# Patient Record
Sex: Female | Born: 1970 | Race: White | Hispanic: No | Smoking: Never smoker
Health system: Southern US, Community
[De-identification: ages and names within clinical notes are randomized; demographics above are authoritative.]

## PROBLEM LIST (undated history)

## (undated) DIAGNOSIS — Z9189 Other specified personal risk factors, not elsewhere classified: Secondary | ICD-10-CM

## (undated) DIAGNOSIS — G43909 Migraine, unspecified, not intractable, without status migrainosus: Secondary | ICD-10-CM

## (undated) DIAGNOSIS — J309 Allergic rhinitis, unspecified: Secondary | ICD-10-CM

## (undated) HISTORY — DX: Other specified personal risk factors, not elsewhere classified: Z91.89

## (undated) HISTORY — DX: Migraine, unspecified, not intractable, without status migrainosus: G43.909

## (undated) HISTORY — DX: Allergic rhinitis, unspecified: J30.9

---

## 1999-04-03 ENCOUNTER — Other Ambulatory Visit: Admission: RE | Admit: 1999-04-03 | Discharge: 1999-04-03 | Payer: Self-pay | Admitting: Obstetrics and Gynecology

## 2000-04-20 ENCOUNTER — Other Ambulatory Visit: Admission: RE | Admit: 2000-04-20 | Discharge: 2000-04-20 | Payer: Self-pay | Admitting: Obstetrics and Gynecology

## 2001-04-20 ENCOUNTER — Other Ambulatory Visit: Admission: RE | Admit: 2001-04-20 | Discharge: 2001-04-20 | Payer: Self-pay | Admitting: Obstetrics and Gynecology

## 2002-04-11 ENCOUNTER — Other Ambulatory Visit: Admission: RE | Admit: 2002-04-11 | Discharge: 2002-04-11 | Payer: Self-pay | Admitting: Obstetrics and Gynecology

## 2002-04-14 ENCOUNTER — Ambulatory Visit (HOSPITAL_COMMUNITY): Admission: RE | Admit: 2002-04-14 | Discharge: 2002-04-14 | Payer: Self-pay | Admitting: Obstetrics and Gynecology

## 2002-04-14 ENCOUNTER — Encounter (INDEPENDENT_AMBULATORY_CARE_PROVIDER_SITE_OTHER): Payer: Self-pay

## 2003-02-16 ENCOUNTER — Emergency Department (HOSPITAL_COMMUNITY): Admission: EM | Admit: 2003-02-16 | Discharge: 2003-02-16 | Payer: Self-pay | Admitting: Emergency Medicine

## 2003-04-23 ENCOUNTER — Other Ambulatory Visit: Admission: RE | Admit: 2003-04-23 | Discharge: 2003-04-23 | Payer: Self-pay | Admitting: Obstetrics and Gynecology

## 2003-08-29 ENCOUNTER — Encounter (INDEPENDENT_AMBULATORY_CARE_PROVIDER_SITE_OTHER): Payer: Self-pay | Admitting: Specialist

## 2003-08-29 ENCOUNTER — Inpatient Hospital Stay (HOSPITAL_COMMUNITY): Admission: AD | Admit: 2003-08-29 | Discharge: 2003-09-01 | Payer: Self-pay | Admitting: Obstetrics and Gynecology

## 2003-10-08 ENCOUNTER — Other Ambulatory Visit: Admission: RE | Admit: 2003-10-08 | Discharge: 2003-10-08 | Payer: Self-pay | Admitting: Obstetrics and Gynecology

## 2004-11-20 ENCOUNTER — Other Ambulatory Visit: Admission: RE | Admit: 2004-11-20 | Discharge: 2004-11-20 | Payer: Self-pay | Admitting: Obstetrics and Gynecology

## 2010-03-16 HISTORY — PX: LIPOMA EXCISION: SHX5283

## 2010-04-10 ENCOUNTER — Ambulatory Visit
Admission: RE | Admit: 2010-04-10 | Discharge: 2010-04-10 | Payer: Self-pay | Source: Home / Self Care | Attending: Surgery | Admitting: Surgery

## 2010-04-10 LAB — POCT HEMOGLOBIN-HEMACUE: Hemoglobin: 14 g/dL (ref 12.0–15.0)

## 2010-04-14 NOTE — Op Note (Signed)
  NAME:  Alyssa Livingston, Alyssa Livingston               ACCOUNT NO.:  [id number]  MEDICAL RECORD NO.:  [id number]          PATIENT TYPE:  AMB  LOCATION:  [facility]                         FACILITY:  [facility]  PHYSICIAN:  Velora Heckler, MD      DATE OF BIRTH:  Nov 01, 1970  DATE OF PROCEDURE: DATE OF DISCHARGE:                              OPERATIVE REPORT   PREOPERATIVE DIAGNOSIS:  Soft tissue mass, right costal margin.  POSTOPERATIVE DIAGNOSIS:  Soft tissue mass, right costal margin.  PROCEDURE:  Excision soft tissue mass, right costal margin (4.5 x 4.0 x 2.0 cm).  SURGEON:  Velora Heckler, M.D., FACS  ANESTHESIA:  Local with intravenous sedation per Dr. Lestine Box.  ESTIMATED BLOOD LOSS:  Minimal.  PREPARATION:  Betadine.  COMPLICATIONS:  None.  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  She presents with a soft tissue mass on the right costal margin, which has gradually increased in size and causes minor discomfort.  She desires surgical excision for definitive diagnosis.  BODY OF REPORT:  Procedure was done in OR #1 at the [facility].  The patient was brought to the operating room and placed in a supine position on the operating room table.  Following administration of general anesthesia, the patient is prepped and draped in the usual strict, aseptic fashion.  After ascertaining that an adequate level of sedation had been obtained, the skin was anesthetized with local anesthetic.  A 3 cm incision was made over the mass.  The skin was undermined circumferentially with electrocautery used for hemostasis. The mass was localized and gently dissected out using the electrocautery to divide tissue.  Dissection was carried down to the underlying muscle fascia.  Fascia was excised with the attached mass.  Mass was relatively fibrous.  It appeared to be unilobular.  It was excised in its entirety with hemostasis obtained  with electrocautery.  The mass measures 4.5 x 4.0 x 2.0 cm and is submitted in its entirety to Pathology for review.  The wound was inspected for hemostasis.  Subcutaneous tissues were closed with interrupted 3-0 Vicryl sutures.  Skin was closed with a running 4-0 Monocryl subcuticular suture.  Wound was washed and dried, and Benzoin and Steri-Strips were applied.  Sterile dressings were applied.  The patient was awakened from anesthesia and brought to the recovery room.  The patient tolerated the procedure well.   Velora Heckler, MD, FACS     TMG/MEDQ  D:  04/10/2010  T:  04/10/2010  Job:  [id number]  Electronically Signed by Darnell Level MD on 04/14/2010 10:21:08 AM

## 2010-05-15 LAB — HM PAP SMEAR: HM Pap smear: NORMAL

## 2010-07-25 ENCOUNTER — Other Ambulatory Visit: Payer: Self-pay | Admitting: Obstetrics and Gynecology

## 2010-07-25 DIAGNOSIS — Z1231 Encounter for screening mammogram for malignant neoplasm of breast: Secondary | ICD-10-CM

## 2010-08-01 NOTE — Op Note (Signed)
   NAME:  Alyssa Livingston, Alyssa Livingston                         ACCOUNT NO.:  [id number]   MEDICAL RECORD NO.:  [id number]                   PATIENT TYPE:  AMB   LOCATION:  [facility]                                  FACILITY:  [facility]   PHYSICIAN:  Michelle L. Vincente Poli, M.D.            DATE OF BIRTH:  07-01-1970   DATE OF PROCEDURE:  04/14/2002  DATE OF DISCHARGE:                                 OPERATIVE REPORT   PREOPERATIVE DIAGNOSES:  Missed abortion.   POSTOPERATIVE DIAGNOSES:  Missed abortion.   PROCEDURE:  Dilatation and evacuation.   SURGEON:  Michelle L. Vincente Poli, M.D.   ANESTHESIA:  MAC with paracervical.   COMPLICATIONS:  None.   PROCEDURE:  The patient was taken to the operating room.  She was then given  sedation and placed in the lithotomy position.  Examination under anesthesia  revealed a 9 cm uterus which was anteverted.  The vagina and vulva were  prepped and draped in the usual sterile fashion.  In-and-out catheter was  used to empty the bladder.  The speculum was inserted into the vagina.  The  cervix was grasped with a tenaculum.  A paracervical block was performed at  5 and 7 o'clock.  The uterus was sounded to 9 cm and cervical internal os  was gently dilated using Pratt dilators.  A number 7 suction cannula was  inserted into the uterus without difficulty and a suction curettage was  performed with retrieval of contents grossly consistent with products of  conception.  This was performed x2.  At the end of the procedure sharp  curette was inserted into the uterus.  The uterus was thoroughly curetted of  all tissue and there was no bleeding at the end of the procedure.  All  sponge, lap, and instrument counts were correct x2.  The patient tolerated  procedure well and went to recovery room in stable condition.                                               Michelle L. Vincente Poli, M.D.    Florestine Avers  D:  04/14/2002  T:  04/14/2002  Job:  [id number]

## 2010-08-07 ENCOUNTER — Ambulatory Visit (HOSPITAL_COMMUNITY)
Admission: RE | Admit: 2010-08-07 | Discharge: 2010-08-07 | Disposition: A | Discharge status: Home / Self Care | Payer: 59 | Source: Ambulatory Visit | Attending: Obstetrics and Gynecology | Admitting: Obstetrics and Gynecology

## 2010-08-07 DIAGNOSIS — Z1231 Encounter for screening mammogram for malignant neoplasm of breast: Secondary | ICD-10-CM | POA: Insufficient documentation

## 2010-08-07 DIAGNOSIS — Z803 Family history of malignant neoplasm of breast: Secondary | ICD-10-CM

## 2010-08-15 LAB — HM MAMMOGRAPHY: HM Mammogram: NORMAL

## 2011-06-09 ENCOUNTER — Ambulatory Visit (INDEPENDENT_AMBULATORY_CARE_PROVIDER_SITE_OTHER): Payer: 59 | Admitting: Internal Medicine

## 2011-06-09 ENCOUNTER — Encounter: Payer: Self-pay | Admitting: Internal Medicine

## 2011-06-09 VITALS — BP 118/82 | HR 89 | Temp 98.2°F | Ht 61.0 in | Wt 123.0 lb

## 2011-06-09 DIAGNOSIS — Z23 Encounter for immunization: Secondary | ICD-10-CM

## 2011-06-09 DIAGNOSIS — Z0000 Encounter for general adult medical examination without abnormal findings: Secondary | ICD-10-CM

## 2011-06-09 DIAGNOSIS — J309 Allergic rhinitis, unspecified: Secondary | ICD-10-CM | POA: Insufficient documentation

## 2011-06-09 MED ORDER — FLUTICASONE PROPIONATE 50 MCG/ACT NA SUSP: 2.0000 | Freq: Every day | NASAL | Status: DC

## 2011-06-09 MED ORDER — KETOTIFEN FUMARATE 0.025 % OP SOLN: 1.0000 [drp] | Freq: Two times a day (BID) | OPHTHALMIC | Status: AC

## 2011-06-09 NOTE — Progress Notes (Signed)
  Subjective:    Patient ID: Alyssa Livingston, female    DOB: 26-Jan-1971, 40 y.o.   MRN: [id number]  HPI New patient to me and our practice, here today to establish care Also, patient is here today for annual physical. Patient feels well and has no complaints.  Past Medical History  Diagnosis Date  . Migraines   . Allergic rhinitis, cause unspecified    Family History  Problem Relation Age of Onset  . Arthritis Mother   . Breast cancer Mother   . Arthritis Father   . Stroke Other   . Prostate cancer Other    History  Substance Use Topics  . Smoking status: Never Smoker   . Smokeless tobacco: Not on file   Comment: works at [employer], divorced, lives with kid  . Alcohol Use: Yes    Review of Systems Constitutional: Negative for fever or weight change.  Respiratory: Negative for cough and shortness of breath.  +seasonal allergies and sinus headache  Cardiovascular: Negative for chest pain or palpitations.  Gastrointestinal: Negative for abdominal pain, no bowel changes.  Musculoskeletal: Negative for gait problem or joint swelling.  Skin: Negative for rash.  Neurological: Negative for dizziness or headache.  No other specific complaints in a complete review of systems (except as listed in HPI above).     Objective:   Physical Exam BP 142/80  Pulse 89  Temp(Src) 98.2 F (36.8 C) (Oral)  Ht 5\' 1"  (1.549 m)  Wt 123 lb (55.792 kg)  BMI 23.24 kg/m2  SpO2 99% Wt Readings from Last 3 Encounters:  06/09/11 123 lb (55.792 kg)   Constitutional: She is fit, appears well-developed and well-nourished. No distress.  HENT: Head: Normocephalic and atraumatic. Ears: B TMs ok, no erythema or effusion; Nose: Nose normal. Mouth/Throat: Oropharynx is clear and moist. No oropharyngeal exudate.  Eyes: Conjunctivae and EOM are normal. Pupils are equal, round, and reactive to light. No scleral icterus.  Neck: Normal range of motion. Neck supple. No JVD present. No thyromegaly present.    Cardiovascular: Normal rate, regular rhythm and normal heart sounds.  No murmur heard. No BLE edema. Pulmonary/Chest: Effort normal and breath sounds normal. No respiratory distress. She has no wheezes.  Abdominal: Soft. Bowel sounds are normal. She exhibits no distension. There is no tenderness. no masses Musculoskeletal: Normal range of motion, no joint effusions. No gross deformities Neurological: She is alert and oriented to person, place, and time. No cranial nerve deficit. Coordination normal.  Skin: Skin is warm and dry. No rash noted. No erythema.  Psychiatric: She has a normal mood and affect. Her behavior is normal. Judgment and thought content normal.   Lab Results  Component Value Date   HGB 14.0 04/10/2010        Assessment & Plan:  CPX - v70.0 - Patient has been counseled on age-appropriate routine health concerns for screening and prevention. These are reviewed and up-to-date. Immunizations are up-to-date or declined. Labs ordered and will be reviewed.

## 2011-06-09 NOTE — Assessment & Plan Note (Signed)
Start flonase and use Zyrtec eye drops prn - erx done Continue daily oral antihistamine with decongestant as needed for sinus headache symptoms

## 2011-06-09 NOTE — Patient Instructions (Addendum)
It was good to see you today. Test(s) ordered today. Return for labs only once your fasting. Your results will be called to you after review (48-72hours after test completion). If any changes need to be made, you will be notified at that time. Health Maintenance reviewed - tetanus (Tdap) booster given, everything else is up to date.  Start flonase nose spray everyday and use zyrtec allergy eye drops as needed - Your prescription(s) have been submitted to your pharmacy. Please take as directed and contact our office if you believe you are having problem(s) with the medication(s). Please schedule followup in annually for physical and labs, call sooner if problems.

## 2011-09-01 ENCOUNTER — Other Ambulatory Visit: Payer: Self-pay | Admitting: Obstetrics and Gynecology

## 2011-09-01 DIAGNOSIS — Z1231 Encounter for screening mammogram for malignant neoplasm of breast: Secondary | ICD-10-CM

## 2011-09-24 ENCOUNTER — Ambulatory Visit (HOSPITAL_COMMUNITY)
Admission: RE | Admit: 2011-09-24 | Discharge: 2011-09-24 | Disposition: A | Discharge status: Home / Self Care | Payer: 59 | Source: Ambulatory Visit | Attending: Obstetrics and Gynecology | Admitting: Obstetrics and Gynecology

## 2011-09-24 DIAGNOSIS — Z1231 Encounter for screening mammogram for malignant neoplasm of breast: Secondary | ICD-10-CM | POA: Insufficient documentation

## 2011-09-24 IMAGING — MG MM DIGITAL SCREENING BILAT
4 series · 4 of 4 positions shown · non-contrast
Comparison: Previous exams

CLINICAL DATA: Screening.

DIGITAL SCREENING MAMMOGRAM WITH CAD

[R CC]
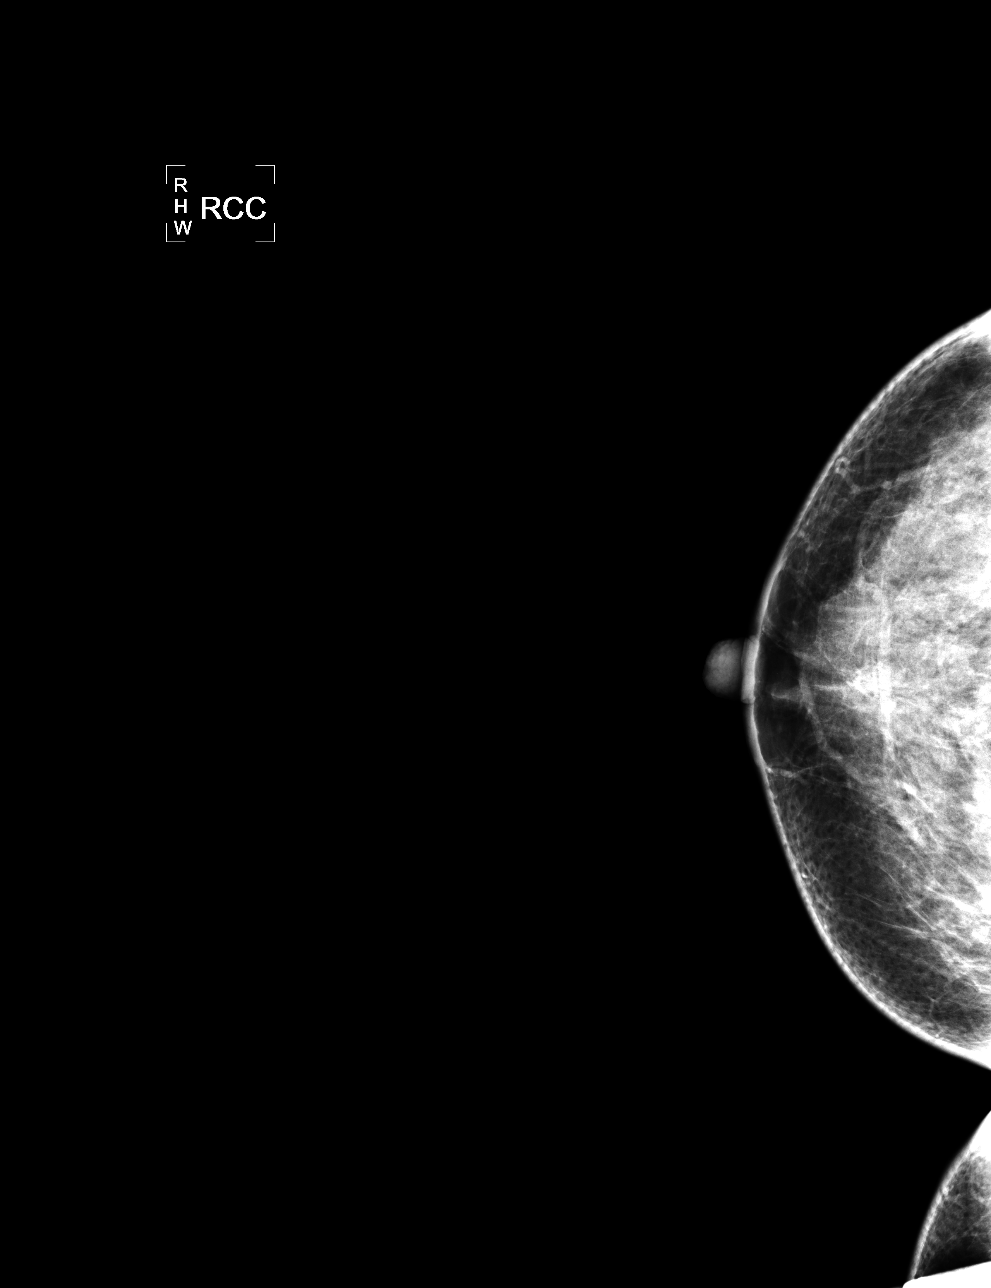

[R MLO]
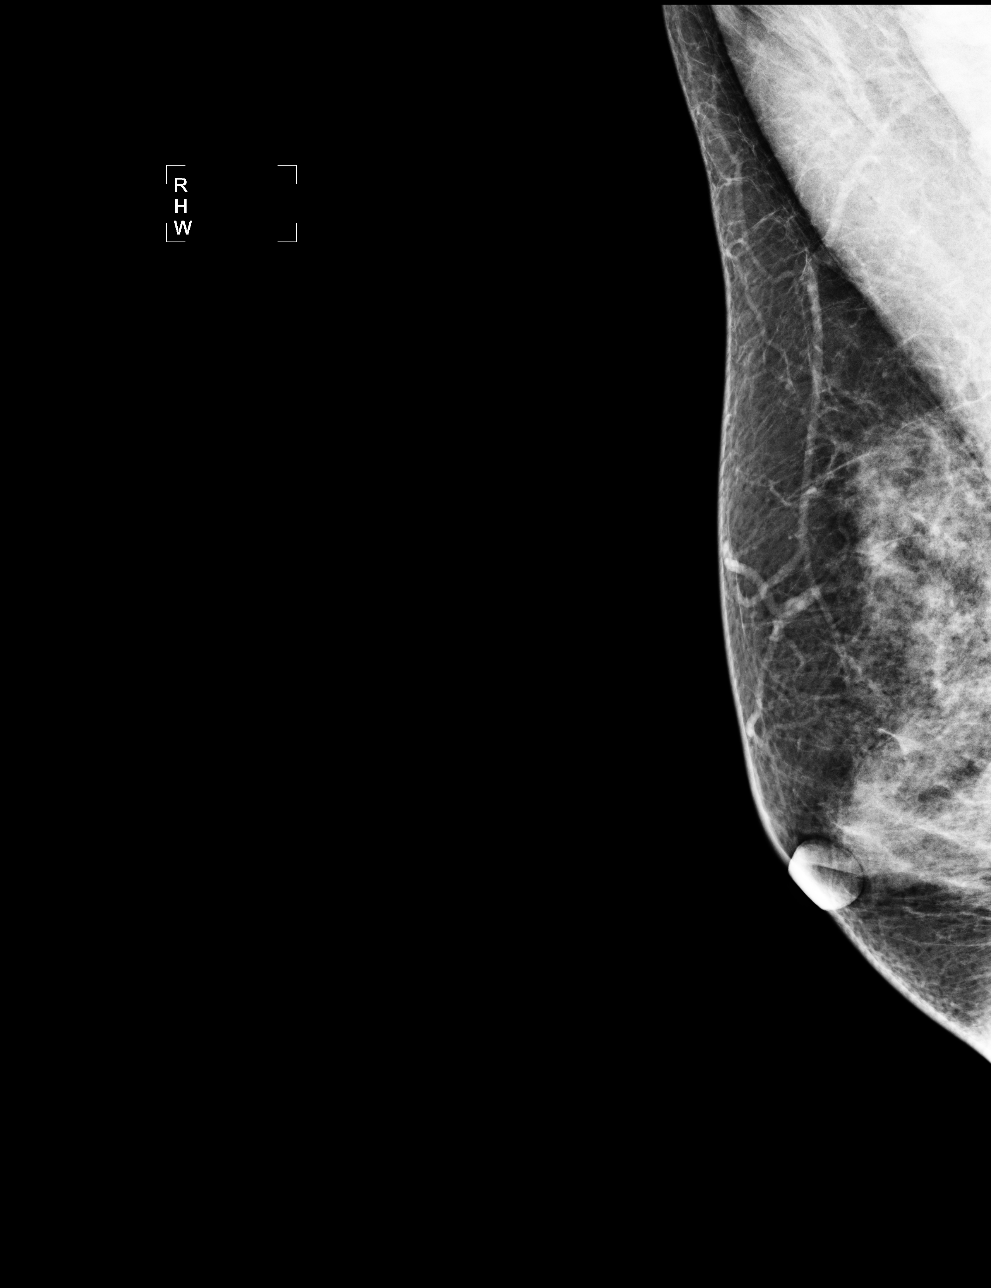

[L CC]
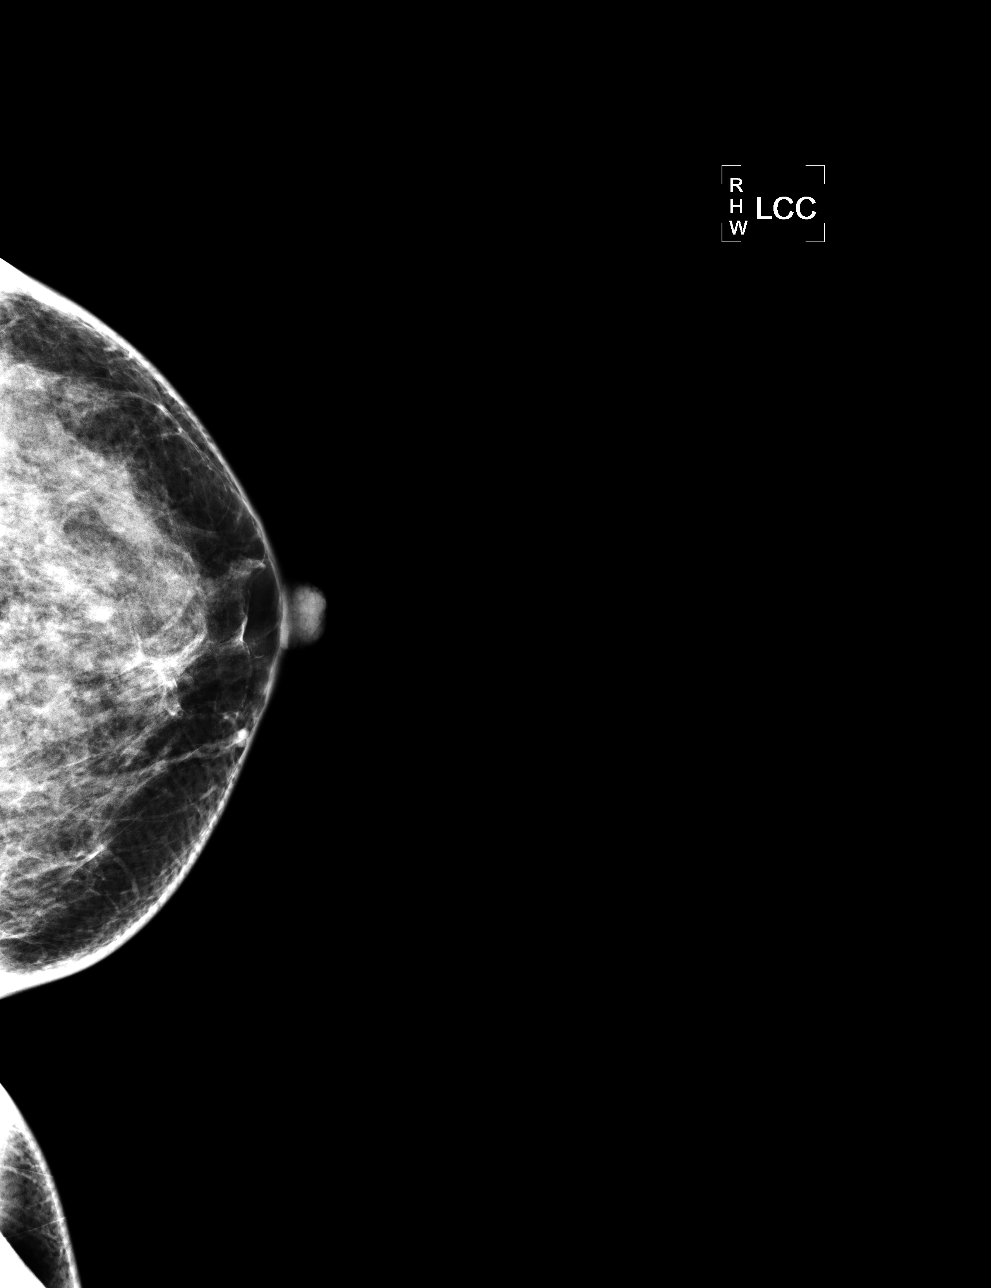

[L MLO]
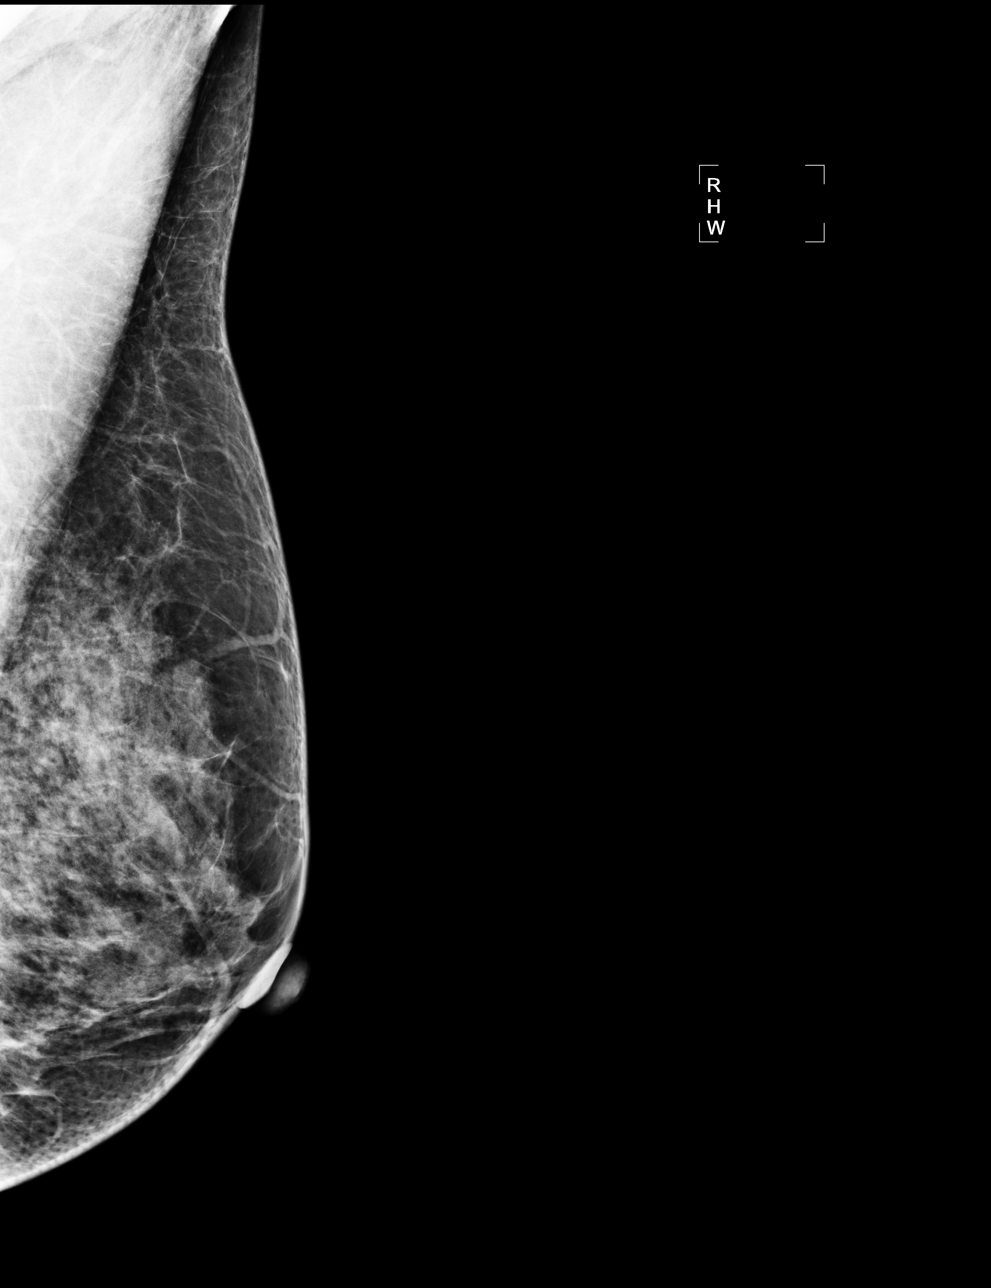

[4 of 4 positions shown; findings below may reference images not displayed]

FINDINGS: The breast tissue is heterogeneously dense. No
suspicious masses, architectural distortion, or calcifications are
present.

Images were processed with CAD.
IMPRESSION: No specific mammographic evidence of malignancy.

A result letter of this screening mammogram will be mailed directly
to the patient.

RECOMMENDATION:
Screening mammogram in one year. (Code:[VK])

BI-RADS CATEGORY 1:  Negative

## 2012-01-14 ENCOUNTER — Other Ambulatory Visit: Payer: Self-pay

## 2012-08-05 ENCOUNTER — Encounter: Payer: Self-pay | Admitting: Obstetrics and Gynecology

## 2012-08-05 ENCOUNTER — Ambulatory Visit (INDEPENDENT_AMBULATORY_CARE_PROVIDER_SITE_OTHER): Payer: 59 | Admitting: Obstetrics and Gynecology

## 2012-08-05 VITALS — BP 124/70 | Ht 61.25 in | Wt 119.0 lb

## 2012-08-05 DIAGNOSIS — Z01419 Encounter for gynecological examination (general) (routine) without abnormal findings: Secondary | ICD-10-CM

## 2012-08-05 DIAGNOSIS — Z0000 Encounter for general adult medical examination without abnormal findings: Secondary | ICD-10-CM

## 2012-08-05 LAB — POCT URINALYSIS DIPSTICK
Leukocytes, UA: NEGATIVE
Nitrite, UA: NEGATIVE
Protein, UA: NEGATIVE
pH, UA: 7

## 2012-08-05 MED ORDER — FLUTICASONE PROPIONATE 50 MCG/ACT NA SUSP: 2.0000 | NASAL | Status: AC | PRN

## 2012-08-05 NOTE — Progress Notes (Signed)
42 y.o.   Divorced    Caucasian   female   G2P1011   here for annual exam.  Menses regular and nl.  Not SA.    Patient's last menstrual period was 07/28/2012.          Sexually active: no  The current method of family planning is abstinence.    Exercising: fitness center, cardio, yoga, weights Last mammogram:  09/24/11 neg Last pap smear:06/11/10 neg History of abnormal pap: no Smoking:no Alcohol:1-2 glasses of wine a week Last colonoscopy:never Last Bone Density:  Heel scan normal Last tetanus shot:05/2011  Thinks it had pertussis in it, too Last cholesterol check: 01/2011 normal  Hgb: 13.9               Urine:neg   Family History  Problem Relation Age of Onset  . Arthritis Mother   . Breast cancer Mother 70  . Arthritis Father   . Stroke Maternal Grandmother   . Prostate cancer Maternal Grandfather     Patient Active Problem List   Diagnosis Date Noted  . Allergic rhinitis, cause unspecified 06/09/2011    Past Medical History  Diagnosis Date  . Migraines   . Allergic rhinitis, cause unspecified     Past Surgical History  Procedure Laterality Date  . Lipoma excision  03/2010    right flank    Allergies: Review of patient's allergies indicates no known allergies.  Current Outpatient Prescriptions  Medication Sig Dispense Refill  . Calcium Carbonate Antacid (TUMS PO) Take by mouth daily.      . cetirizine (ZYRTEC) 10 MG tablet Take 10 mg by mouth daily.      . Ibuprofen (ADVIL PO) Take by mouth as needed.      . Multiple Vitamins-Minerals (MULTIVITAMIN PO) Take by mouth daily.      . SUMAtriptan Succinate (IMITREX PO) Take by mouth as needed.      . fluticasone (FLONASE) 50 MCG/ACT nasal spray Place 2 sprays into the nose daily.  16 g  2  . fluticasone (FLONASE) 50 MCG/ACT nasal spray Place 2 sprays into the nose as needed for rhinitis.       No current facility-administered medications for this visit.    ROS: Pertinent items are noted in HPI.  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   Exam:    BP 124/70  Ht 5' 1.25" (1.556 m)  Wt 119 lb (53.978 kg)  BMI 22.29 kg/m2  LMP 07/28/2012  Ht stable and wt down 2 pounds Wt Readings from Last 3 Encounters:  08/05/12 119 lb (53.978 kg)  06/09/11 123 lb (55.792 kg)     Ht Readings from Last 3 Encounters:  08/05/12 5' 1.25" (1.556 m)  06/09/11 5\' 1"  (1.549 m)    General appearance: alert, cooperative and appears stated age Head: Normocephalic, without obvious abnormality, atraumatic Neck: no adenopathy, supple, symmetrical, trachea midline and thyroid not enlarged, symmetric, no tenderness/mass/nodules Lungs: clear to auscultation bilaterally Breasts: Inspection negative, No nipple retraction or dimpling, No nipple discharge or bleeding, No axillary or supraclavicular adenopathy, Normal to palpation without dominant masses Heart: regular rate and rhythm Abdomen: soft, non-tender; bowel sounds normal; no masses,  no organomegaly Extremities: extremities normal, atraumatic, no cyanosis or edema Skin: Skin color, texture, turgor normal. No rashes or lesions Lymph nodes: Cervical, supraclavicular, and axillary nodes normal. No abnormal inguinal nodes palpated Neurologic: Grossly normal   Pelvic: External genitalia:  no lesions  Urethra:  normal appearing urethra with no masses, tenderness or lesions              Bartholins and Skenes: normal                 Vagina: normal appearing vagina with normal color and discharge, no lesions              Cervix: normal appearance              Pap taken: no        Bimanual Exam:  Uterus:  uterus is normal size, shape, consistency and nontender, AF, mobile                                      Adnexa: normal adnexa in size, nontender and no masses                                      Rectovaginal: Confirms                                      Anus:  normal sphincter tone, no lesions  A: normal gyn  exam, not SA     P: mammogram counseled on breast self exam, mammography screening, adequate intake of calcium and vitamin D, diet and exercise return annually or prn     An After Visit Summary was printed and given to the patient.

## 2012-08-05 NOTE — Patient Instructions (Signed)

## 2012-10-28 ENCOUNTER — Other Ambulatory Visit: Payer: Self-pay | Admitting: Obstetrics and Gynecology

## 2012-10-28 DIAGNOSIS — Z1231 Encounter for screening mammogram for malignant neoplasm of breast: Secondary | ICD-10-CM

## 2012-11-11 ENCOUNTER — Ambulatory Visit (HOSPITAL_COMMUNITY)
Admission: RE | Admit: 2012-11-11 | Discharge: 2012-11-11 | Disposition: A | Discharge status: Home / Self Care | Payer: 59 | Source: Ambulatory Visit | Attending: Obstetrics and Gynecology | Admitting: Obstetrics and Gynecology

## 2012-11-11 ENCOUNTER — Other Ambulatory Visit: Payer: Self-pay | Admitting: Obstetrics and Gynecology

## 2012-11-11 DIAGNOSIS — Z1231 Encounter for screening mammogram for malignant neoplasm of breast: Secondary | ICD-10-CM | POA: Insufficient documentation

## 2012-11-11 IMAGING — MG MM DIGITAL BREAST 3D TOMOSYNTHESIS
8 series · 9 of 24 positions shown · non-contrast
Comparison: [DATE]

CLINICAL DATA: Screening.

DIGITAL SCREENING BILATERAL MAMMOGRAM WITH 3D TOMO WITH CAD
DIGITAL BREAST TOMOSYNTHESIS
Digital breast tomosynthesis images are acquired in two
projections.  These images are reviewed in combination with the
digital mammogram, confirming the findings below.

[R CC]
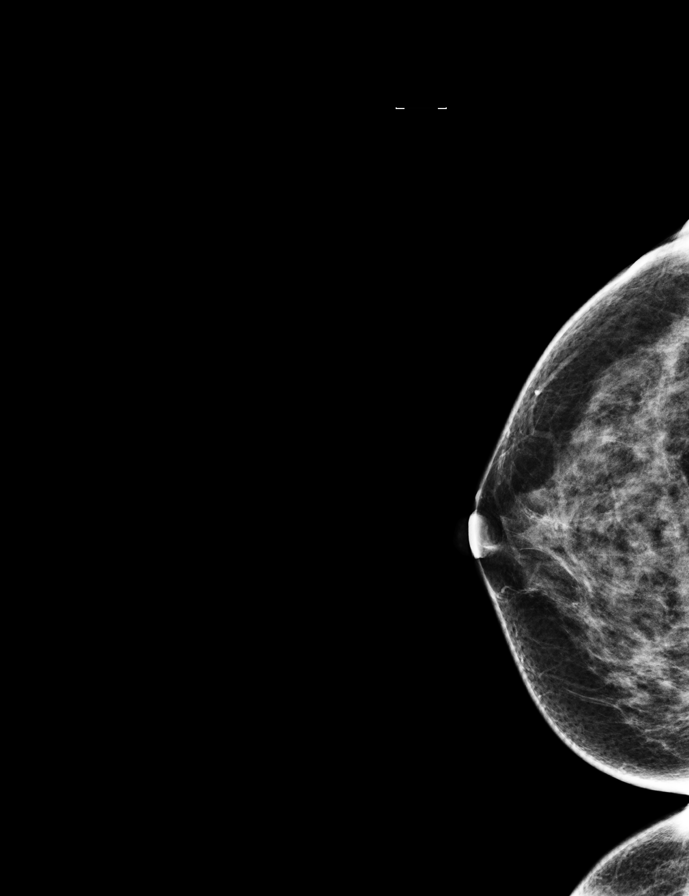

[L CC]
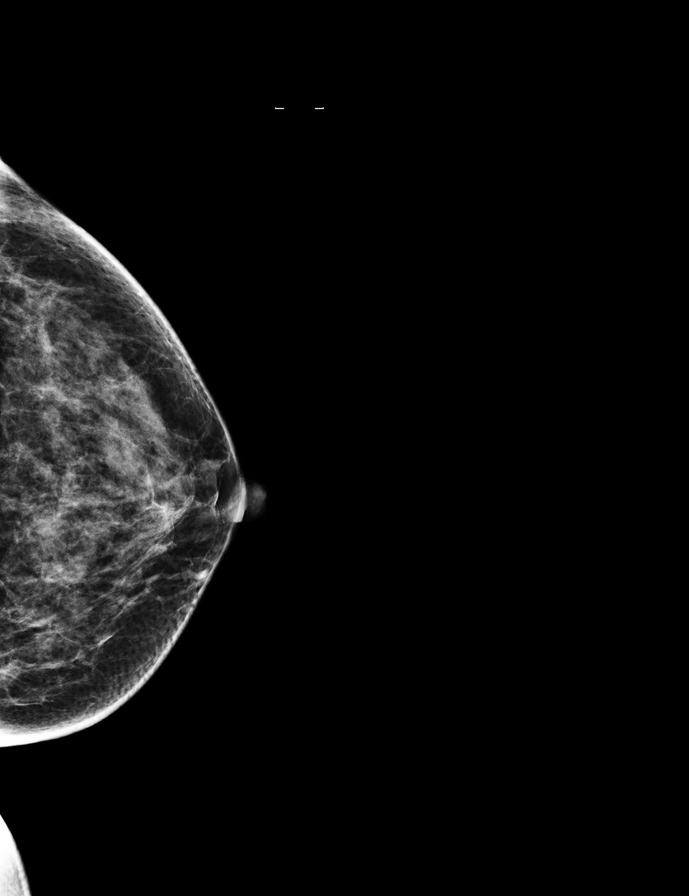

[R MLO]
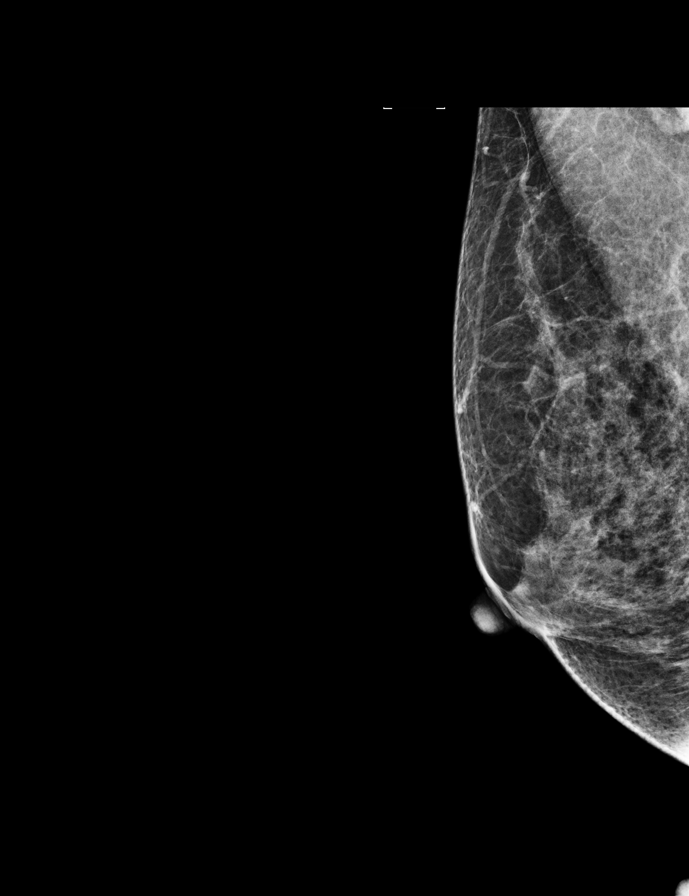

[L MLO]
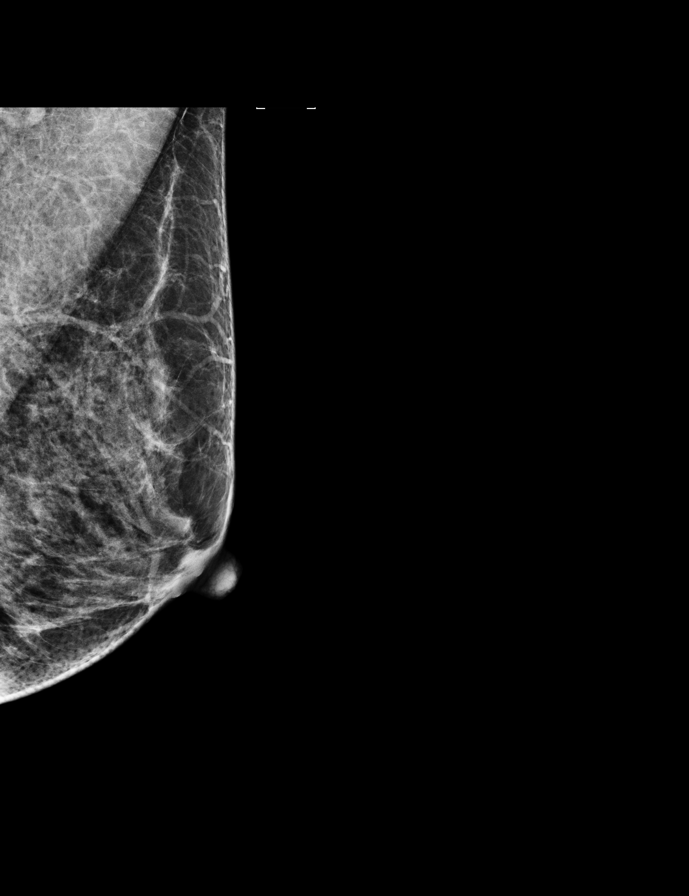

[L CC tomo · 2 of 54 frames shown]
[frame 18/54]
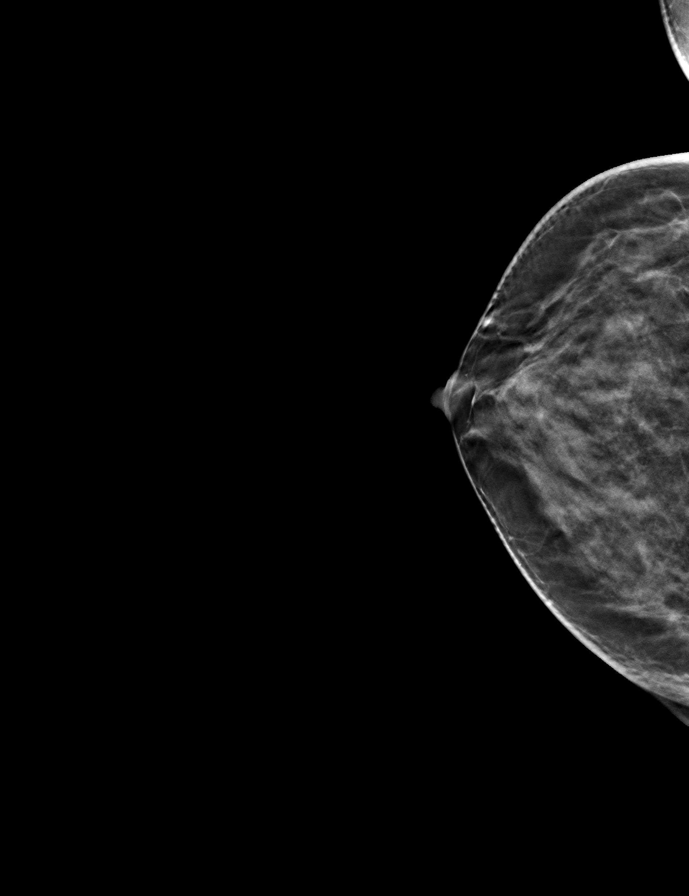
[frame 27/54]
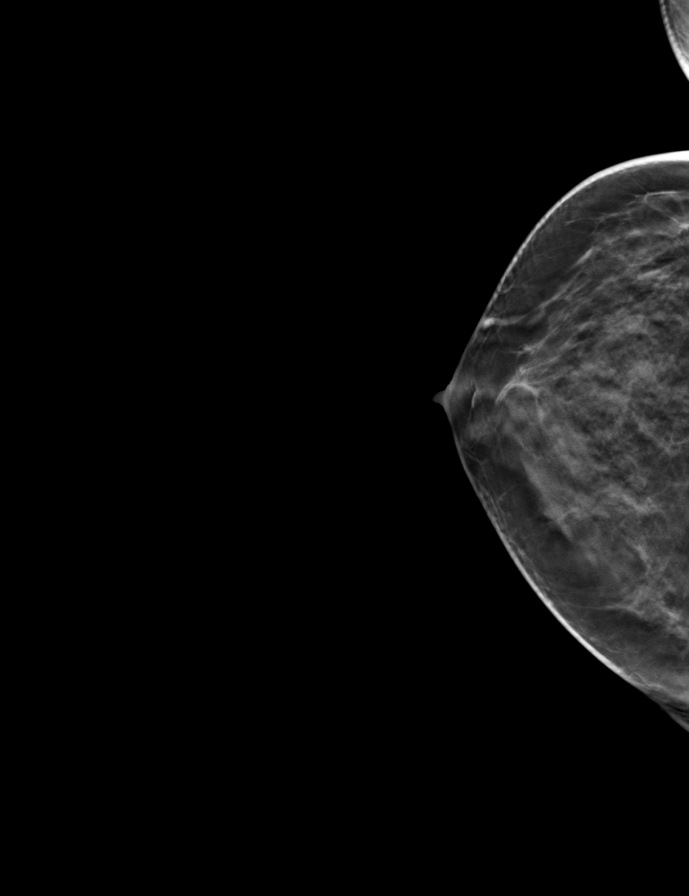

[R CC tomo · tomo slice 30/59.0]
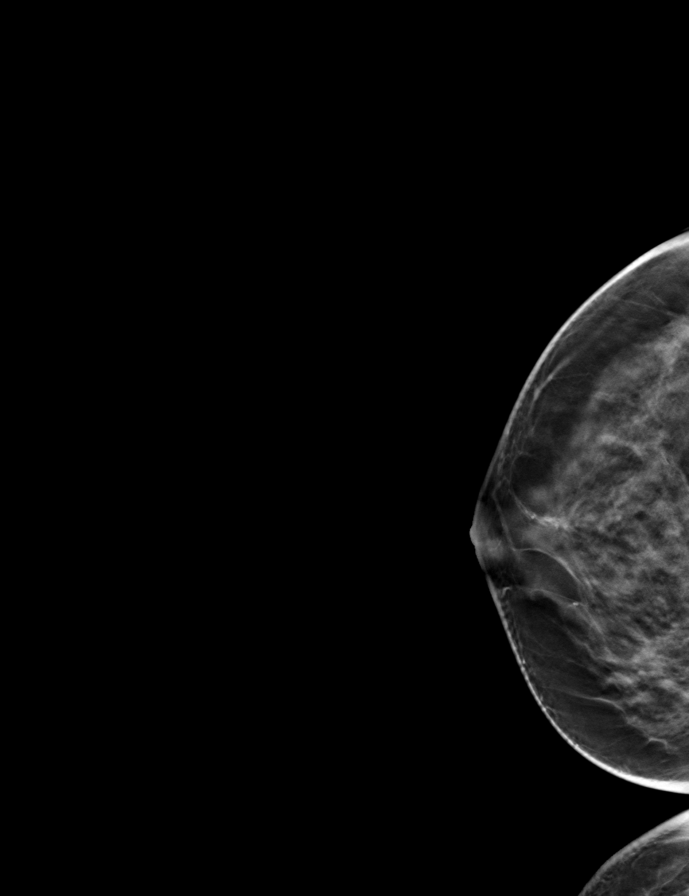

[R MLO tomo · tomo slice 26/51.0]
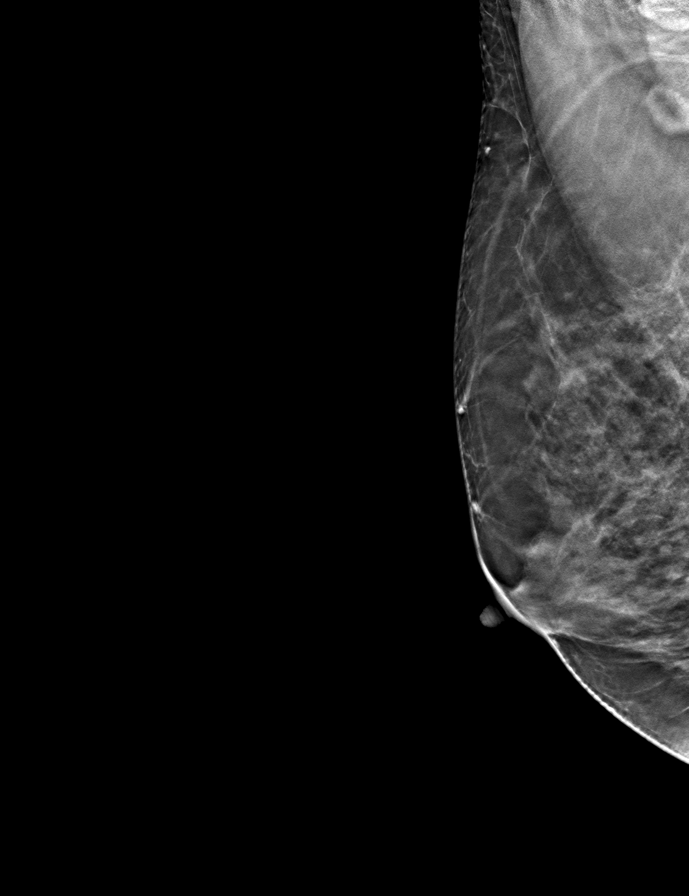

[L MLO tomo · tomo slice 26/51.0]
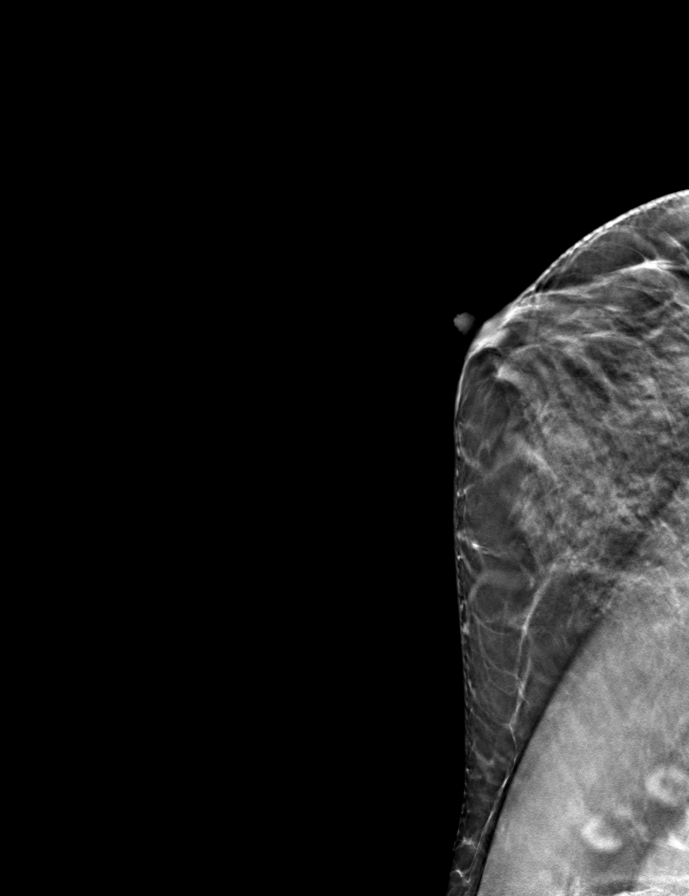

[9 of 24 positions shown; findings below may reference images not displayed]

FINDINGS: ACR Breast Density Category c:  The breast tissue is
heterogeneously dense, which may obscure small masses.

There is no suspicious dominant mass, architectural distortion, or
calcification to suggest malignancy.

Images were processed with CAD.
IMPRESSION: No mammographic evidence of malignancy.

A result letter of this screening mammogram will be mailed directly
to the patient.

RECOMMENDATION:
Screening mammogram in one year. (Code:[L3])

BI-RADS CATEGORY 1:  Negative.

## 2013-04-06 ENCOUNTER — Encounter: Payer: Self-pay | Admitting: Internal Medicine

## 2013-04-06 ENCOUNTER — Ambulatory Visit (INDEPENDENT_AMBULATORY_CARE_PROVIDER_SITE_OTHER): Payer: 59 | Admitting: Internal Medicine

## 2013-04-06 VITALS — BP 124/86 | HR 80 | Temp 97.8°F | Resp 16 | Ht 61.0 in | Wt 118.0 lb

## 2013-04-06 DIAGNOSIS — J020 Streptococcal pharyngitis: Secondary | ICD-10-CM | POA: Insufficient documentation

## 2013-04-06 MED ORDER — AZITHROMYCIN 500 MG PO TABS: 500.0000 mg | ORAL_TABLET | Freq: Every day | ORAL | Status: DC

## 2013-04-06 NOTE — Progress Notes (Signed)
Pre visit review using our clinic review tool, if applicable. No additional management support is needed unless otherwise documented below in the visit note. 

## 2013-04-06 NOTE — Patient Instructions (Signed)
Sore Throat A sore throat is pain, burning, irritation, or scratchiness of the throat. There is often pain or tenderness when swallowing or talking. A sore throat may be accompanied by other symptoms, such as coughing, sneezing, fever, and swollen neck glands. A sore throat is often the first sign of another sickness, such as a cold, flu, strep throat, or mononucleosis (commonly known as mono). Most sore throats go away without medical treatment. CAUSES  The most common causes of a sore throat include:  A viral infection, such as a cold, flu, or mono.  A bacterial infection, such as strep throat, tonsillitis, or whooping cough.  Seasonal allergies.  Dryness in the air.  Irritants, such as smoke or pollution.  Gastroesophageal reflux disease (GERD). HOME CARE INSTRUCTIONS   Only take over-the-counter medicines as directed by your caregiver.  Drink enough fluids to keep your urine clear or pale yellow.  Rest as needed.  Try using throat sprays, lozenges, or sucking on hard candy to ease any pain (if older than 4 years or as directed).  Sip warm liquids, such as broth, herbal tea, or warm water with honey to relieve pain temporarily. You may also eat or drink cold or frozen liquids such as frozen ice pops.  Gargle with salt water (mix 1 tsp salt with 8 oz of water).  Do not smoke and avoid secondhand smoke.  Put a cool-mist humidifier in your bedroom at night to moisten the air. You can also turn on a hot shower and sit in the bathroom with the door closed for 5 10 minutes. SEEK IMMEDIATE MEDICAL CARE IF:  You have difficulty breathing.  You are unable to swallow fluids, soft foods, or your saliva.  You have increased swelling in the throat.  Your sore throat does not get better in 7 days.  You have nausea and vomiting.  You have a fever or persistent symptoms for more than 2 3 days.  You have a fever and your symptoms suddenly get worse. MAKE SURE YOU:   Understand  these instructions.  Will watch your condition.  Will get help right away if you are not doing well or get worse. Document Released: 04/09/2004 Document Revised: 02/17/2012 Document Reviewed: 11/08/2011 [employer] Patient Information 2014 [employer].  

## 2013-04-07 ENCOUNTER — Encounter: Payer: Self-pay | Admitting: Internal Medicine

## 2013-04-07 NOTE — Progress Notes (Signed)
Subjective:    Patient ID: Alyssa Livingston, female    DOB: February 06, 1971, 43 y.o.   MRN: [id number]  Sore Throat  This is a new problem. The current episode started in the past 7 days. The problem has been unchanged. Neither side of throat is experiencing more pain than the other. There has been no fever. The fever has been present for less than 1 day. The pain is at a severity of 0/10. The patient is experiencing no pain. Associated symptoms include swollen glands. Pertinent negatives include no abdominal pain, congestion, coughing, diarrhea, drooling, ear discharge, ear pain, headaches, hoarse voice, plugged ear sensation, neck pain, shortness of breath, stridor, trouble swallowing or vomiting. She has had exposure to strep. She has tried nothing for the symptoms.      Review of Systems  Constitutional: Negative.  Negative for fever, chills, diaphoresis and fatigue.  HENT: Positive for sore throat. Negative for congestion, drooling, ear discharge, ear pain, hoarse voice, sinus pressure, tinnitus, trouble swallowing and voice change.   Eyes: Negative.   Respiratory: Negative for cough, shortness of breath and stridor.   Cardiovascular: Negative.  Negative for chest pain, palpitations and leg swelling.  Gastrointestinal: Negative.  Negative for vomiting, abdominal pain and diarrhea.  Endocrine: Negative.   Genitourinary: Negative.   Musculoskeletal: Negative.  Negative for arthralgias and neck pain.  Skin: Negative.  Negative for rash.  Allergic/Immunologic: Negative.   Neurological: Negative.  Negative for headaches.  Hematological: Negative.  Negative for adenopathy. Does not bruise/bleed easily.  Psychiatric/Behavioral: Negative.   All other systems reviewed and are negative.       Objective:   Physical Exam  Vitals reviewed. Constitutional: She is oriented to person, place, and time. She appears well-developed and well-nourished.  Non-toxic appearance. She does not have a sickly  appearance. She does not appear ill. No distress.  HENT:  Head: Normocephalic and atraumatic.  Mouth/Throat: Mucous membranes are normal. Mucous membranes are not pale, not dry and not cyanotic. No oral lesions. No trismus in the jaw. No uvula swelling. Posterior oropharyngeal erythema present. No oropharyngeal exudate, posterior oropharyngeal edema or tonsillar abscesses.  Eyes: Conjunctivae are normal. Right eye exhibits no discharge. Left eye exhibits no discharge. No scleral icterus.  Neck: Normal range of motion. Neck supple. No JVD present. No tracheal deviation present. No thyromegaly present.  Cardiovascular: Normal rate, regular rhythm, normal heart sounds and intact distal pulses.  Exam reveals no gallop and no friction rub.   No murmur heard. Pulmonary/Chest: Effort normal and breath sounds normal. No stridor. No respiratory distress. She has no wheezes. She has no rales. She exhibits no tenderness.  Abdominal: Soft. Bowel sounds are normal. She exhibits no distension and no mass. There is no tenderness. There is no rebound and no guarding.  Musculoskeletal: Normal range of motion. She exhibits no edema and no tenderness.  Lymphadenopathy:       Head (right side): No submental, no submandibular, no tonsillar, no preauricular, no posterior auricular and no occipital adenopathy present.       Head (left side): No submental, no submandibular, no tonsillar, no preauricular, no posterior auricular and no occipital adenopathy present.    She has no cervical adenopathy.    She has no axillary adenopathy.       Right: No supraclavicular adenopathy present.       Left: No supraclavicular adenopathy present.  Neurological: She is oriented to person, place, and time.  Skin: Skin is warm and dry. No  rash noted. She is not diaphoretic. No erythema. No pallor.          Assessment & Plan:

## 2013-04-07 NOTE — Assessment & Plan Note (Signed)
She has no features that are suspicious yet her daughter was diagnosed with strep throat 3 days ago so I will treat with a Zpak

## 2013-08-04 ENCOUNTER — Telehealth: Payer: Self-pay | Admitting: Obstetrics and Gynecology

## 2013-08-04 NOTE — Telephone Encounter (Signed)
Confirming pts appt °

## 2013-08-09 ENCOUNTER — Ambulatory Visit: Payer: 59 | Admitting: Obstetrics and Gynecology

## 2013-08-10 ENCOUNTER — Encounter: Payer: Self-pay | Admitting: Obstetrics and Gynecology

## 2013-08-10 ENCOUNTER — Ambulatory Visit (INDEPENDENT_AMBULATORY_CARE_PROVIDER_SITE_OTHER): Payer: 59 | Admitting: Obstetrics and Gynecology

## 2013-08-10 VITALS — BP 138/80 | HR 100 | Ht 61.0 in | Wt 119.8 lb

## 2013-08-10 DIAGNOSIS — Z0000 Encounter for general adult medical examination without abnormal findings: Secondary | ICD-10-CM

## 2013-08-10 DIAGNOSIS — Z01419 Encounter for gynecological examination (general) (routine) without abnormal findings: Secondary | ICD-10-CM

## 2013-08-10 LAB — LIPID PANEL
CHOL/HDL RATIO: 2.7 ratio
CHOLESTEROL: 209 mg/dL — AB (ref 0–200)
HDL: 77 mg/dL (ref >39)
LDL Cholesterol: 114 mg/dL — ABNORMAL HIGH (ref 0–99)
TRIGLYCERIDES: 92 mg/dL (ref <150)
VLDL: 18 mg/dL (ref 0–40)

## 2013-08-10 LAB — CBC
HCT: 39.2 % (ref 36.0–46.0)
HEMOGLOBIN: 13.4 g/dL (ref 12.0–15.0)
MCH: 28.7 pg (ref 26.0–34.0)
MCHC: 34.2 g/dL (ref 30.0–36.0)
MCV: 83.9 fL (ref 78.0–100.0)
PLATELETS: 286 10*3/uL (ref 150–400)
RBC: 4.67 MIL/uL (ref 3.87–5.11)
RDW: 13.9 % (ref 11.5–15.5)
WBC: 8.1 10*3/uL (ref 4.0–10.5)

## 2013-08-10 LAB — HEMOGLOBIN, FINGERSTICK: HEMOGLOBIN, FINGERSTICK: 13.4 g/dL (ref 12.0–16.0)

## 2013-08-10 LAB — COMPREHENSIVE METABOLIC PANEL
ALT: 11 U/L (ref 0–35)
AST: 17 U/L (ref 0–37)
Albumin: 4.9 g/dL (ref 3.5–5.2)
Alkaline Phosphatase: 46 U/L (ref 39–117)
BILIRUBIN TOTAL: 1 mg/dL (ref 0.2–1.2)
BUN: 15 mg/dL (ref 6–23)
CO2: 24 meq/L (ref 19–32)
Calcium: 9.3 mg/dL (ref 8.4–10.5)
Chloride: 104 mEq/L (ref 96–112)
Creat: 0.74 mg/dL (ref 0.50–1.10)
GLUCOSE: 94 mg/dL (ref 70–99)
Potassium: 4.1 mEq/L (ref 3.5–5.3)
SODIUM: 136 meq/L (ref 135–145)
TOTAL PROTEIN: 7.4 g/dL (ref 6.0–8.3)

## 2013-08-10 NOTE — Patient Instructions (Signed)

## 2013-08-10 NOTE — Progress Notes (Signed)
Patient ID: Alyssa Livingston, female   DOB: 02-18-1971, 43 y.o.   MRN: [id number] GYNECOLOGY VISIT  PCP:   Rene PaciValerie Leschber, MD  Referring provider:   HPI: 43 y.o.   Divorced  Caucasian  female   [id number] with Patient's last menstrual period was 07/13/2013.   here for AEX.   Menses about 30 days apart and heavy for one day.  Declines birth control.   Has a small acne versus abscess on the vulvar area.   States she has white coat syndrome.  BP usually 115/75 when she takes it on her own.   Hgb:    13.4 Urine:   Neg  GYNECOLOGIC HISTORY: Patient's last menstrual period was 07/13/2013. Sexually active:  no Partner preference: female Contraception:  abstinence  Menopausal hormone therapy: n/a DES exposure:  no    Blood transfusions:   no Sexually transmitted diseases: no   GYN procedures and prior surgeries:  D & C--blighted ovum Last [facility]                Last pap and high risk HPV testing:  06-11-10 wnl  History of abnormal pap smear: no    OB History   Grav Para Term Preterm Abortions TAB SAB Ect Mult Living   2 1 1  1     1        LIFESTYLE: Exercise: yoga, cardio, tabata, and weight training              Tobacco:  no Alcohol:     1-2 drinks per week Drug use:  no  OTHER HEALTH MAINTENANCE: Tetanus/TDap:  05/2011 Gardisil:              n/a Influenza:            12/2012 Zostavax:             n/a  Bone density:       n/a Colonoscopy:       n/a  Cholesterol check:   2013 wnl  Family History  Problem Relation Age of Onset  . Arthritis Mother   . Breast cancer Mother [id number]  . Arthritis Father   . Stroke Maternal Grandmother   . Prostate cancer Maternal Grandfather     Patient Active Problem List   Diagnosis Date Noted  . Streptococcal sore throat 04/06/2013  . Allergic rhinitis, cause unspecified 06/09/2011   Past Medical History  Diagnosis Date  . Migraines   . Allergic rhinitis, cause unspecified     Past Surgical  History  Procedure Laterality Date  . Lipoma excision  03/2010    right flank    ALLERGIES: Review of patient's allergies indicates no known allergies.  Current Outpatient Prescriptions  Medication Sig Dispense Refill  . Calcium Carbonate Antacid (TUMS PO) Take by mouth daily.      . cetirizine (ZYRTEC) 10 MG tablet Take 10 mg by mouth daily.      . fluticasone (FLONASE) 50 MCG/ACT nasal spray Place 2 sprays into the nose as needed for rhinitis.  16 g  6  . Ibuprofen (ADVIL PO) Take by mouth as needed.      . Multiple Vitamins-Minerals (MULTIVITAMIN PO) Take by mouth daily.      . SUMAtriptan Succinate (IMITREX PO) Take by mouth as needed.       No current facility-administered medications for this visit.     ROS:  Pertinent items are noted in HPI.  SOCIAL HISTORY:  Physical therapist.  PHYSICAL EXAMINATION:    BP 138/80  Pulse 100  Ht 5\' 1"  (1.549 m)  Wt 119 lb 12.8 oz (54.341 kg)  BMI 22.65 kg/m2  LMP 07/13/2013   Wt Readings from Last 3 Encounters:  08/10/13 119 lb 12.8 oz (54.341 kg)  04/06/13 118 lb (53.524 kg)  08/05/12 119 lb (53.978 kg)     Ht Readings from Last 3 Encounters:  08/10/13 5\' 1"  (1.549 m)  04/06/13 5\' 1"  (1.549 m)  08/05/12 5' 1.25" (1.556 m)    General appearance: alert, cooperative and appears stated age Head: Normocephalic, without obvious abnormality, atraumatic Neck: no adenopathy, supple, symmetrical, trachea midline and thyroid not enlarged, symmetric, no tenderness/mass/nodules Lungs: clear to auscultation bilaterally Breasts: Inspection negative, No nipple retraction or dimpling, No nipple discharge or bleeding, No axillary or supraclavicular adenopathy, Normal to palpation without dominant masses Heart: regular rate and rhythm Abdomen: soft, non-tender; no masses,  no organomegaly Extremities: extremities normal, atraumatic, no cyanosis or edema Skin: Skin color, texture, turgor normal. No rashes or lesions Lymph nodes: Cervical,  supraclavicular, and axillary nodes normal. No abnormal inguinal nodes palpated Neurologic: Grossly normal  Pelvic: External genitalia:  no lesions              Urethra:  normal appearing urethra with no masses, tenderness or lesions              Bartholins and Skenes: normal                 Vagina: normal appearing vagina with normal color and discharge, no lesions.  4 mm hymenal cyst, nontender.               Cervix: normal appearance              Pap and high risk HPV testing done: yes.            Bimanual Exam:  Uterus:  uterus is normal size, shape, consistency and nontender                                      Adnexa: normal adnexa in size, nontender and no masses                                      Rectovaginal: Confirms                                      Anus:  normal sphincter tone, no lesions  ASSESSMENT  Normal gynecologic exam.  PLAN  Mammogram recommended yearly.  Pap smear and high risk HPV testing performed.  Counseled on self breast exam, Calcium and vitamin D intake, exercise. Lipid profile, CMP, CBC.  Return annually or prn   An After Visit Summary was printed and given to the patient.

## 2013-08-14 LAB — IPS PAP TEST WITH HPV

## 2013-11-09 ENCOUNTER — Encounter: Payer: Self-pay | Admitting: Obstetrics and Gynecology

## 2013-11-09 ENCOUNTER — Other Ambulatory Visit: Payer: Self-pay | Admitting: Obstetrics and Gynecology

## 2013-11-09 DIAGNOSIS — Z1231 Encounter for screening mammogram for malignant neoplasm of breast: Secondary | ICD-10-CM

## 2013-11-13 ENCOUNTER — Ambulatory Visit (HOSPITAL_COMMUNITY)
Admission: RE | Admit: 2013-11-13 | Discharge: 2013-11-13 | Disposition: A | Discharge status: Home / Self Care | Payer: 59 | Source: Ambulatory Visit | Attending: Obstetrics and Gynecology | Admitting: Obstetrics and Gynecology

## 2013-11-13 DIAGNOSIS — Z1231 Encounter for screening mammogram for malignant neoplasm of breast: Secondary | ICD-10-CM | POA: Diagnosis present

## 2013-11-13 IMAGING — MG MM SCREENING BREAST TOMO BILAT
8 series · 9 of 24 positions shown · non-contrast
Comparison: Previous exam(s).

CLINICAL DATA: Screening.

EXAM:
DIGITAL SCREENING BILATERAL MAMMOGRAM WITH 3D TOMO WITH CAD

[R CC]
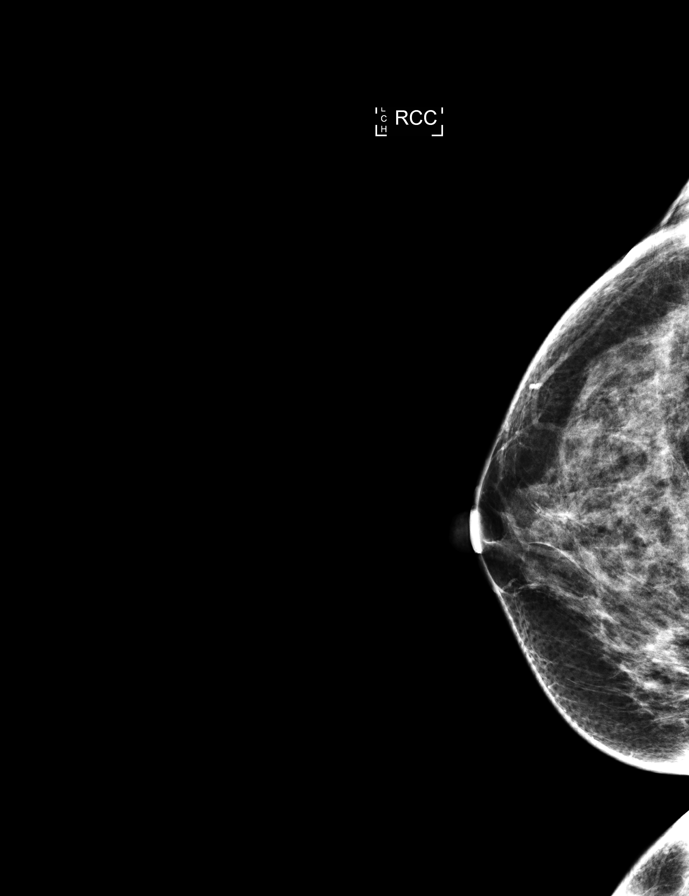

[R MLO]
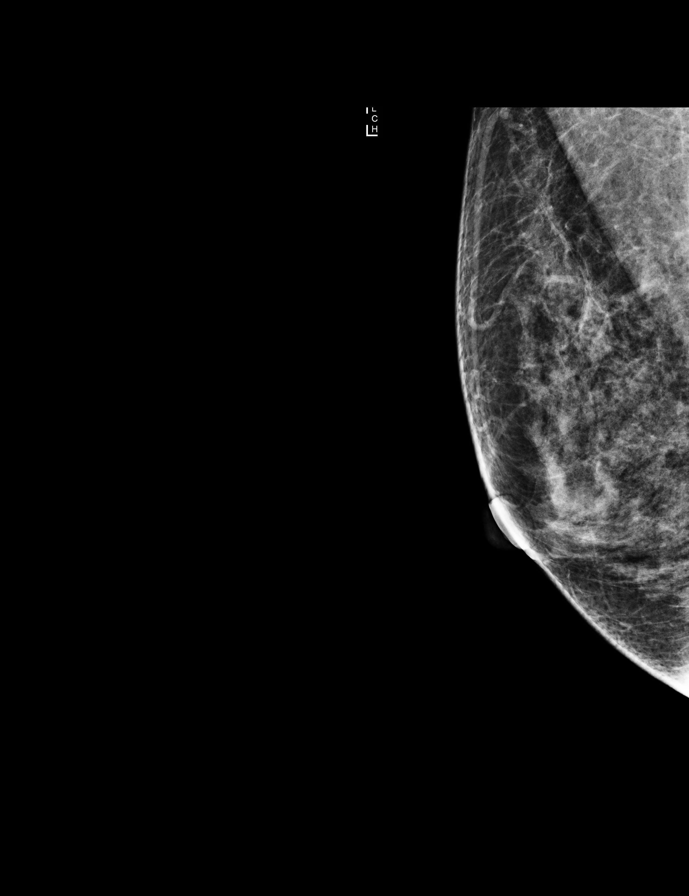

[L CC]
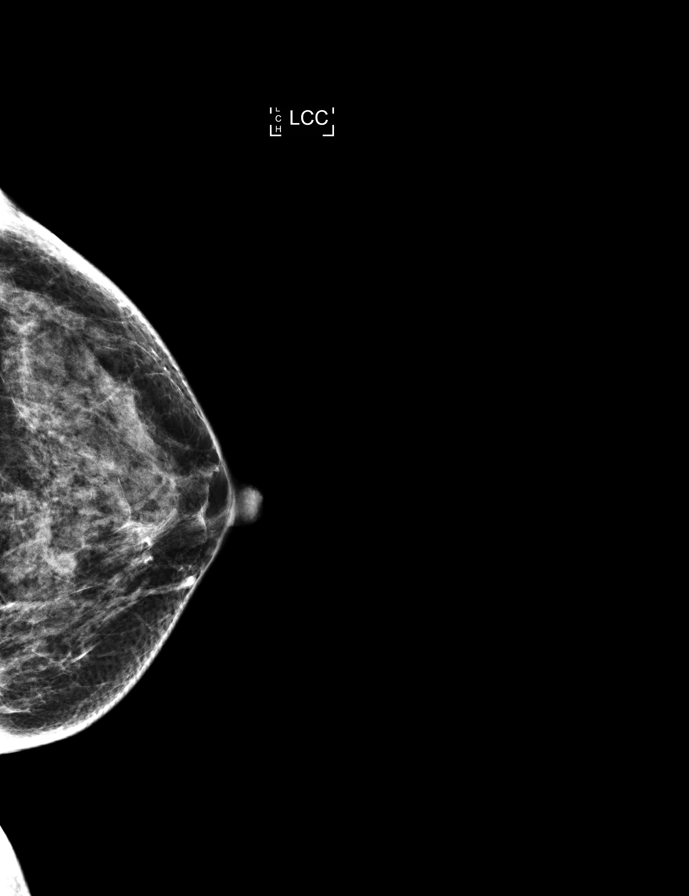

[L MLO]
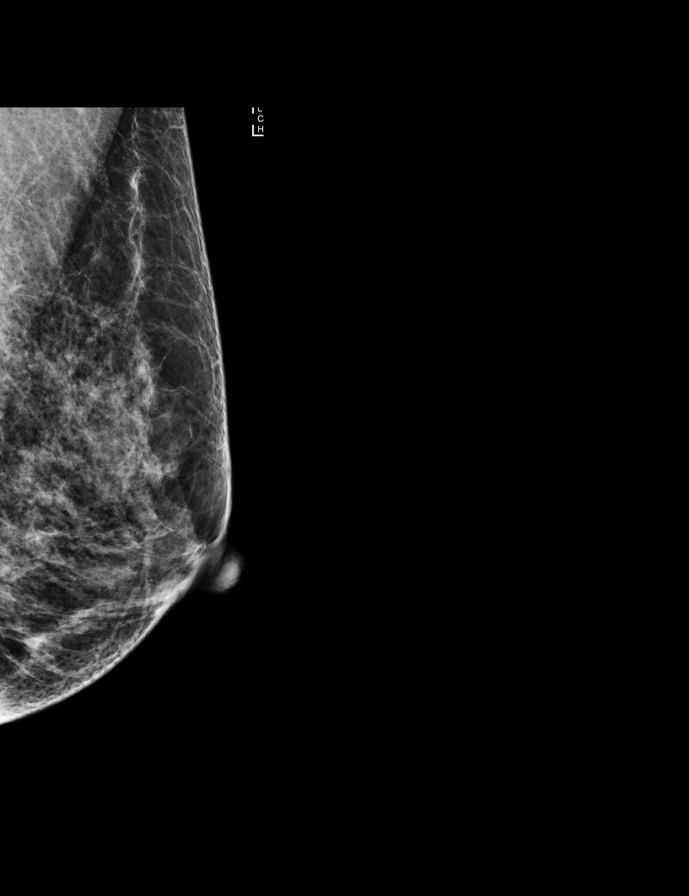

[L CC tomo · 2 of 59 frames shown]
[frame 20/59]
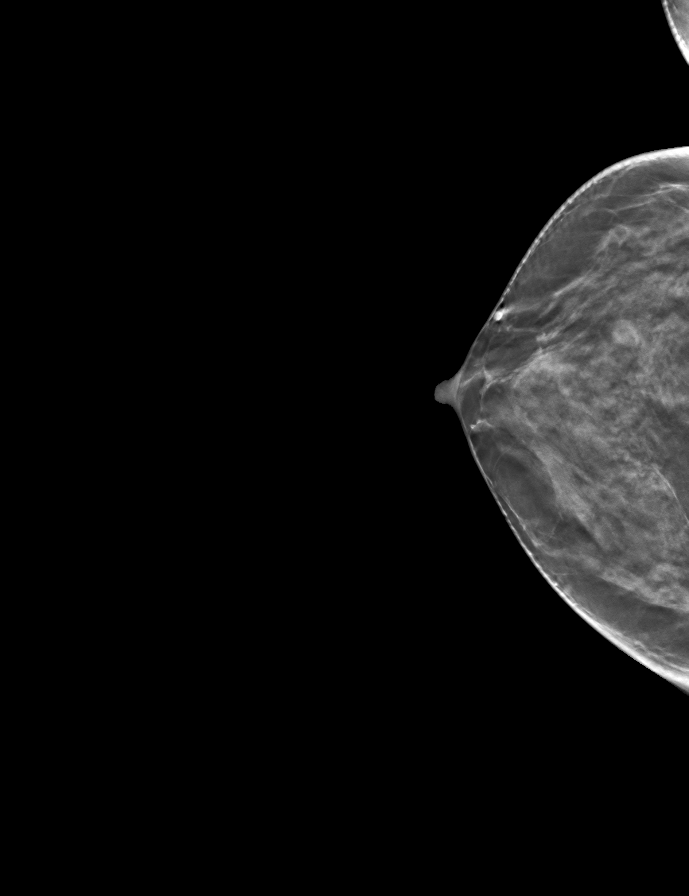
[frame 30/59]
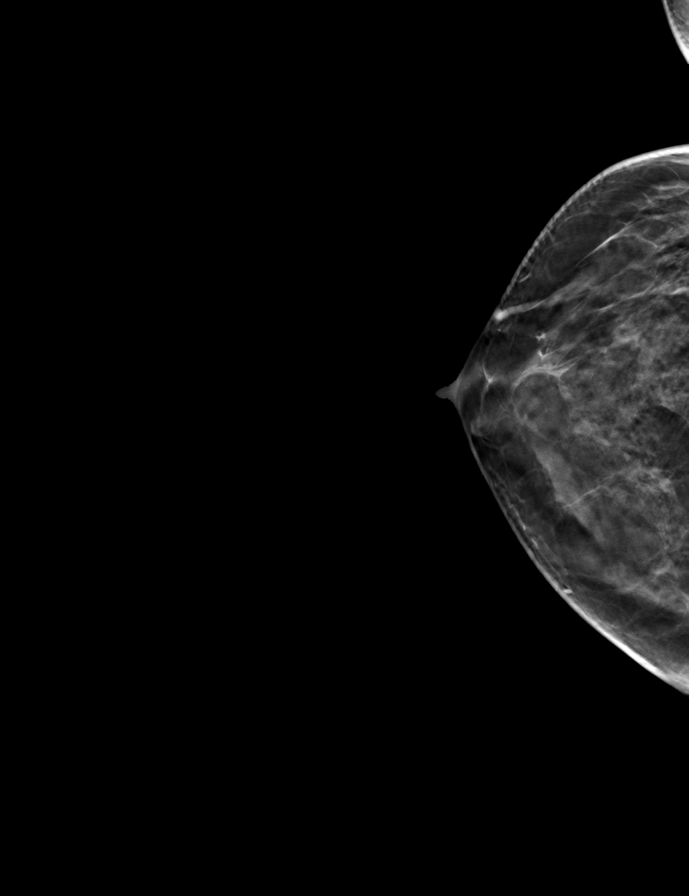

[R MLO tomo · tomo slice 27/52.0]
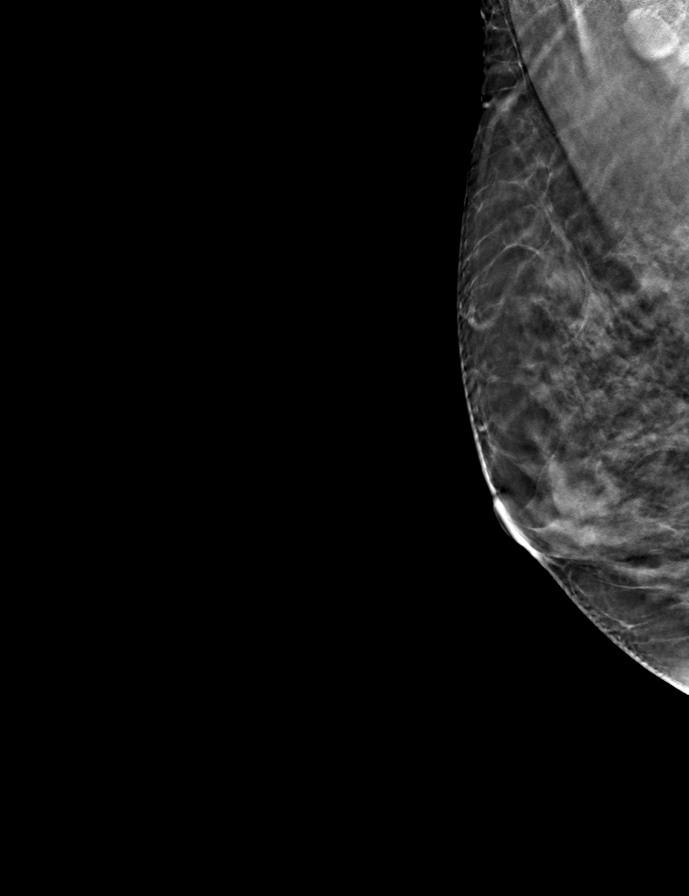

[L MLO tomo · tomo slice 28/55.0]
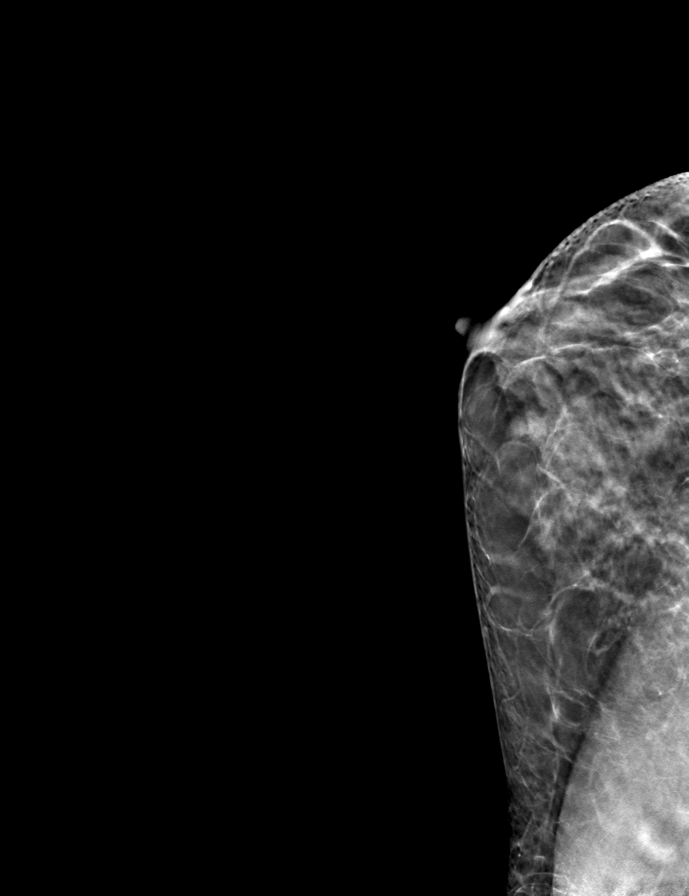

[R CC tomo · tomo slice 34/67.0]
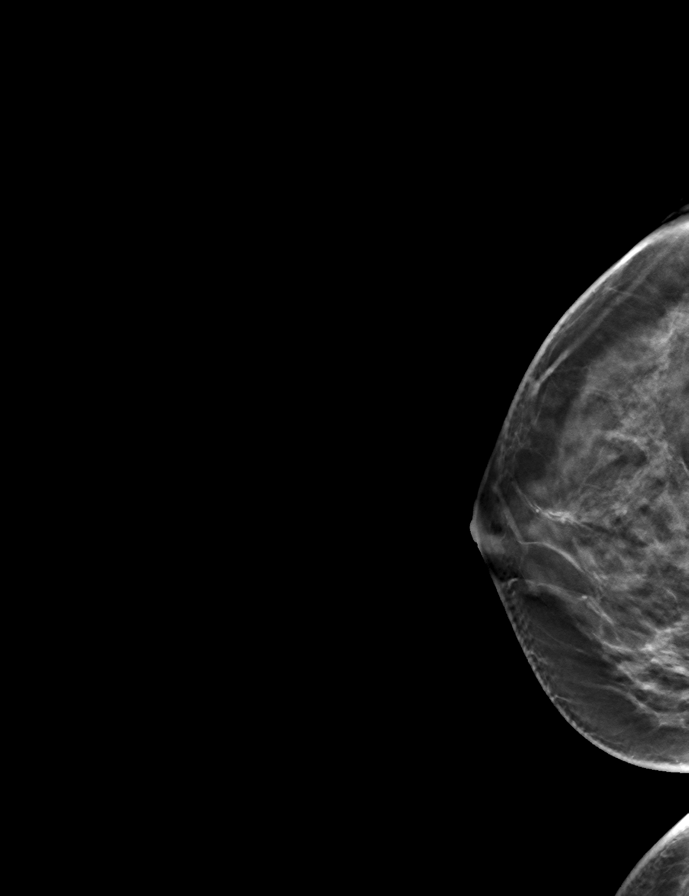

[9 of 24 positions shown; findings below may reference images not displayed]

ACR Breast Density Category c: The breast tissue is heterogeneously
dense, which may obscure small masses.
FINDINGS: There are no findings suspicious for malignancy. Images were
processed with CAD.
IMPRESSION: No mammographic evidence of malignancy. A result letter of this
screening mammogram will be mailed directly to the patient.

RECOMMENDATION:
Screening mammogram in one year. (Code:[SM])

BI-RADS CATEGORY  1: Negative.

## 2014-01-15 ENCOUNTER — Encounter: Payer: Self-pay | Admitting: Obstetrics and Gynecology

## 2014-08-17 ENCOUNTER — Ambulatory Visit: Payer: 59 | Admitting: Obstetrics and Gynecology

## 2014-08-17 ENCOUNTER — Ambulatory Visit (INDEPENDENT_AMBULATORY_CARE_PROVIDER_SITE_OTHER): Payer: 59 | Admitting: Obstetrics and Gynecology

## 2014-08-17 ENCOUNTER — Encounter: Payer: Self-pay | Admitting: Obstetrics and Gynecology

## 2014-08-17 VITALS — BP 130/80 | HR 108 | Resp 18 | Ht 61.0 in | Wt 119.0 lb

## 2014-08-17 DIAGNOSIS — Z0000 Encounter for general adult medical examination without abnormal findings: Secondary | ICD-10-CM | POA: Diagnosis not present

## 2014-08-17 DIAGNOSIS — Z01419 Encounter for gynecological examination (general) (routine) without abnormal findings: Secondary | ICD-10-CM

## 2014-08-17 LAB — LIPID PANEL
CHOLESTEROL: 212 mg/dL — AB (ref 0–200)
HDL: 83 mg/dL (ref >=46)
LDL CALC: 114 mg/dL — AB (ref 0–99)
TRIGLYCERIDES: 74 mg/dL (ref <150)
Total CHOL/HDL Ratio: 2.6 Ratio
VLDL: 15 mg/dL (ref 0–40)

## 2014-08-17 LAB — COMPREHENSIVE METABOLIC PANEL
ALBUMIN: 4.8 g/dL (ref 3.5–5.2)
ALT: 12 U/L (ref 0–35)
AST: 18 U/L (ref 0–37)
Alkaline Phosphatase: 45 U/L (ref 39–117)
BUN: 12 mg/dL (ref 6–23)
CO2: 26 mEq/L (ref 19–32)
CREATININE: 0.64 mg/dL (ref 0.50–1.10)
Calcium: 9.5 mg/dL (ref 8.4–10.5)
Chloride: 102 mEq/L (ref 96–112)
Glucose, Bld: 87 mg/dL (ref 70–99)
Potassium: 4 mEq/L (ref 3.5–5.3)
Sodium: 137 mEq/L (ref 135–145)
TOTAL PROTEIN: 7.4 g/dL (ref 6.0–8.3)
Total Bilirubin: 1.2 mg/dL (ref 0.2–1.2)

## 2014-08-17 LAB — POCT URINALYSIS DIPSTICK
Bilirubin, UA: NEGATIVE
GLUCOSE UA: NEGATIVE
Ketones, UA: NEGATIVE
Leukocytes, UA: NEGATIVE
NITRITE UA: NEGATIVE
PROTEIN UA: NEGATIVE
RBC UA: NEGATIVE
SPEC GRAV UA: 1.015
Urobilinogen, UA: NEGATIVE
pH, UA: 6.5

## 2014-08-17 LAB — CBC
HEMATOCRIT: 42.8 % (ref 36.0–46.0)
HEMOGLOBIN: 14.2 g/dL (ref 12.0–15.0)
MCH: 29.2 pg (ref 26.0–34.0)
MCHC: 33.2 g/dL (ref 30.0–36.0)
MCV: 87.9 fL (ref 78.0–100.0)
MPV: 9.7 fL (ref 8.6–12.4)
Platelets: 272 10*3/uL (ref 150–400)
RBC: 4.87 MIL/uL (ref 3.87–5.11)
RDW: 13.4 % (ref 11.5–15.5)
WBC: 9.7 10*3/uL (ref 4.0–10.5)

## 2014-08-17 LAB — TSH: TSH: 1.14 u[IU]/mL (ref 0.350–4.500)

## 2014-08-17 NOTE — Progress Notes (Signed)
Patient ID: Alyssa Livingston, female   DOB: [birth year], 44 y.o.   MRN: [id number] 44 y.o. [id number] Divorced Caucasian female here for annual exam.    Menses every 30 - 32 days.  One heavy day with back aching.  Ibuprofen helps.   Wants routine blood work. Not sexually active.   PCP:   [facility]  Patient's last menstrual period was 07/22/2014.          Sexually active: No.  The current method of family planning is none.    Exercising: Yes.    yoga strength training and cardio Smoker:  no  Health Maintenance: Pap:  08-10-13 WNL HR HPV History of abnormal Pap:  no MMG:  11-13-13 3D WNL heterogeneously dense [facility]. Colonoscopy:  N/A BMD:   N/A  TDaP:  06-09-11  Screening Labs: labs drawn today Hb today: 13.7, Urine today: WNL   reports that she has never smoked. She has never used smokeless tobacco. She reports that she drinks about 1.2 - 1.8 oz of alcohol per week. She reports that she does not use illicit drugs.  Past Medical History  Diagnosis Date  . Migraines   . Allergic rhinitis, cause unspecified     Past Surgical History  Procedure Laterality Date  . Lipoma excision  03/2010    right flank    Current Outpatient Prescriptions  Medication Sig Dispense Refill  . Calcium Carbonate Antacid (TUMS PO) Take by mouth daily.    . cetirizine (ZYRTEC) 10 MG tablet Take 10 mg by mouth daily.    . fluticasone (FLONASE) 50 MCG/ACT nasal spray Place 2 sprays into the nose as needed for rhinitis. 16 g 6  . Ibuprofen (ADVIL PO) Take by mouth as needed.    . Multiple Vitamins-Minerals (MULTIVITAMIN PO) Take by mouth daily.    . SUMAtriptan Succinate (IMITREX PO) Take by mouth as needed.     No current facility-administered medications for this visit.    Family History  Problem Relation Age of Onset  . Arthritis Mother   . Breast cancer Mother 15  . Arthritis Father   . Stroke Maternal Grandmother   . Prostate cancer Maternal Grandfather     ROS:  Pertinent  items are noted in HPI.  Otherwise, a comprehensive ROS was negative.  Exam:   BP 130/80 mmHg  Pulse 108  Resp 18  Ht  (1.549 m)  Wt 119 lb (53.978 kg)  BMI 22.50 kg/m2  LMP 07/22/2014    General appearance: alert, cooperative and appears stated age Head: Normocephalic, without obvious abnormality, atraumatic Neck: no adenopathy, supple, symmetrical, trachea midline and thyroid normal to inspection and palpation Lungs: clear to auscultation bilaterally Breasts: normal appearance, no masses or tenderness, Inspection negative, No nipple retraction or dimpling, No nipple discharge or bleeding, No axillary or supraclavicular adenopathy Heart: regular rate and rhythm Abdomen: soft, non-tender; bowel sounds normal; no masses,  no organomegaly Extremities: extremities normal, atraumatic, no cyanosis or edema Skin: Skin color, texture, turgor normal. No rashes or lesions Lymph nodes: Cervical, supraclavicular, and axillary nodes normal. No abnormal inguinal nodes palpated Neurologic: Grossly normal  Pelvic: External genitalia:  no lesions              Urethra:  normal appearing urethra with no masses, tenderness or lesions              Bartholins and Skenes: normal  Vagina: normal appearing vagina with normal color and discharge, no lesions              Cervix: no lesions              Pap taken: No. Bimanual Exam:  Uterus:  normal size, contour, position, consistency, mobility, non-tender              Adnexa: normal adnexa and no mass, fullness, tenderness              Rectovaginal: Yes.  .  Confirms.              Anus:  normal sphincter tone, no lesions.  Right perianal skin tag 3 mm at 8:00.  Chaperone was present for exam.  Assessment:   Well woman visit with normal exam.   Plan: Yearly mammogram recommended after age 44.  Recommended self breast exam.  Pap and HR HPV as above. Discussed Calcium, Vitamin D, regular exercise program including cardiovascular  and weight bearing exercise. Labs performed.  Yes.  .   See orders. Refills given on medications.  No..    Follow up annually and prn.      After visit summary provided.

## 2014-08-17 NOTE — Patient Instructions (Signed)

## 2014-08-20 LAB — HEMOGLOBIN, FINGERSTICK: HEMOGLOBIN, FINGERSTICK: 13.7 g/dL (ref 12.0–16.0)

## 2014-10-16 ENCOUNTER — Other Ambulatory Visit: Payer: Self-pay | Admitting: Obstetrics and Gynecology

## 2014-10-16 DIAGNOSIS — Z1231 Encounter for screening mammogram for malignant neoplasm of breast: Secondary | ICD-10-CM

## 2014-11-28 ENCOUNTER — Ambulatory Visit
Admission: RE | Admit: 2014-11-28 | Discharge: 2014-11-28 | Disposition: A | Discharge status: Home / Self Care | Payer: 59 | Source: Ambulatory Visit | Attending: Obstetrics and Gynecology | Admitting: Obstetrics and Gynecology

## 2014-11-28 DIAGNOSIS — Z1231 Encounter for screening mammogram for malignant neoplasm of breast: Secondary | ICD-10-CM

## 2014-11-28 IMAGING — MG MM SCREENING BREAST TOMO BILATERAL
8 series · 9 of 24 positions shown · non-contrast
Comparison: Previous exam(s).

CLINICAL DATA: Screening.

EXAM:
DIGITAL SCREENING BILATERAL MAMMOGRAM WITH 3D TOMO WITH CAD

[L MLO]
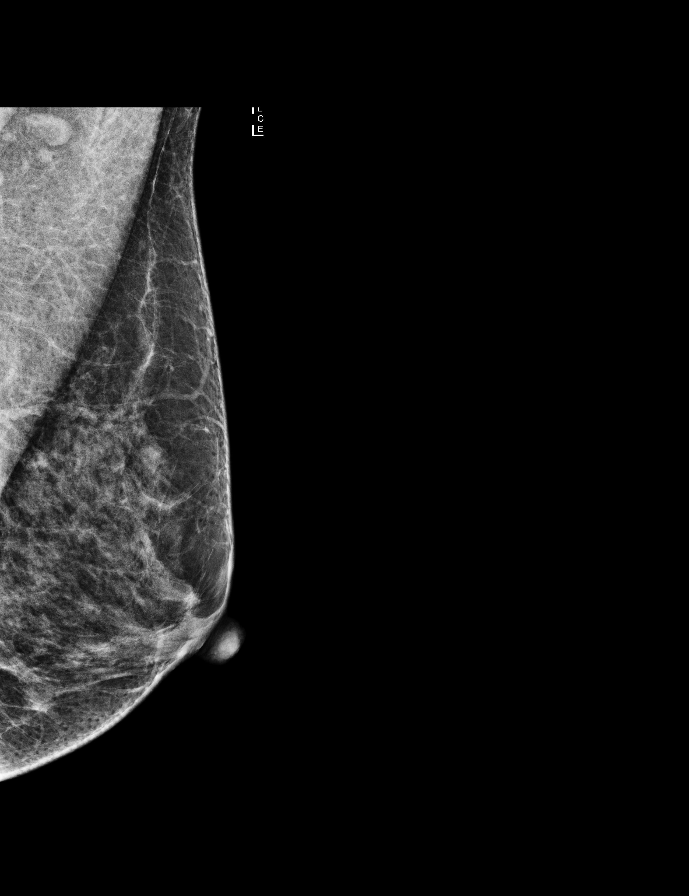

[R MLO]
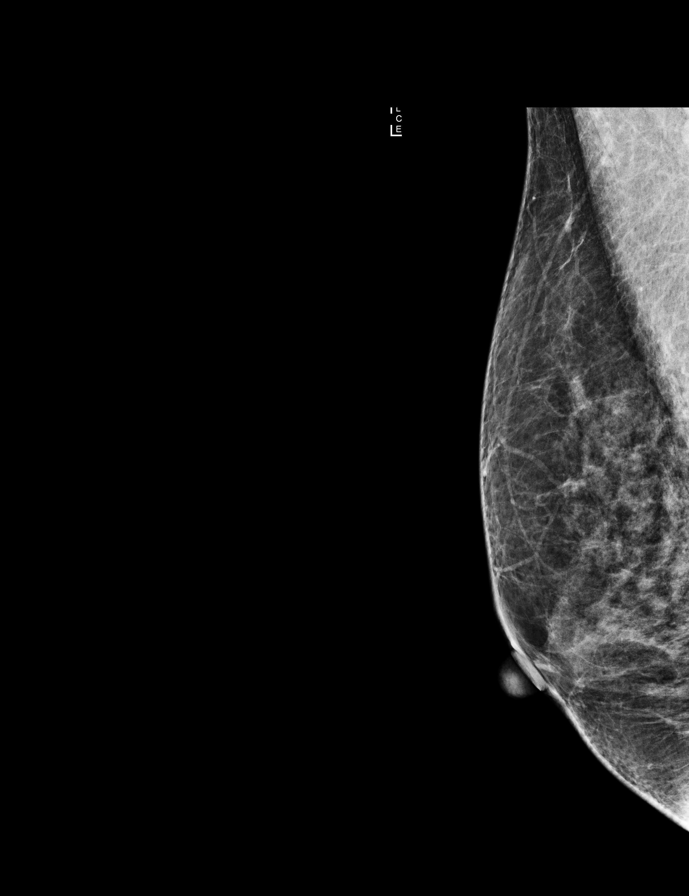

[L CC]
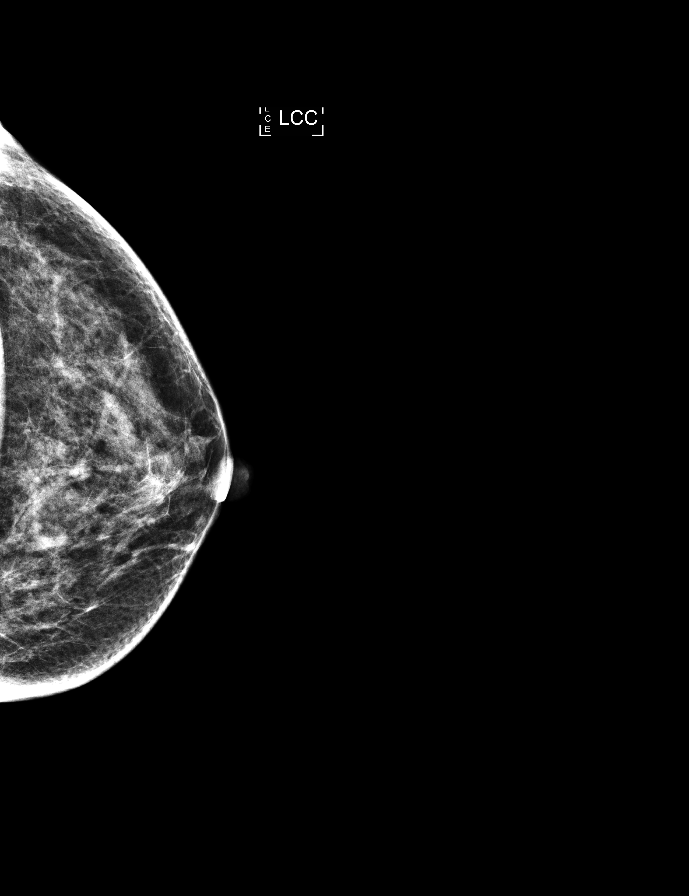

[R CC]
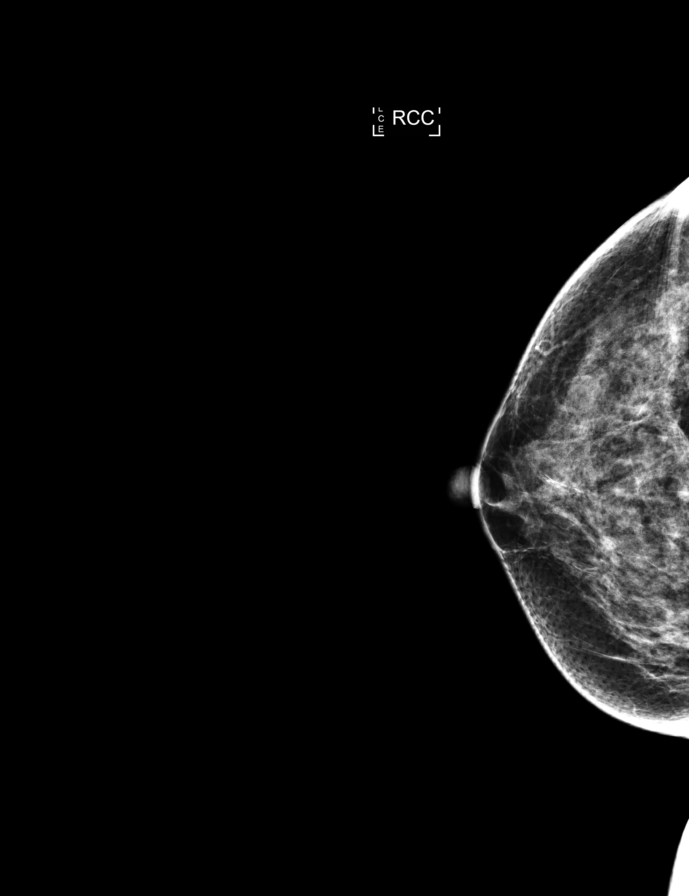

[R MLO tomo · 2 of 51 frames shown]
[frame 17/51]
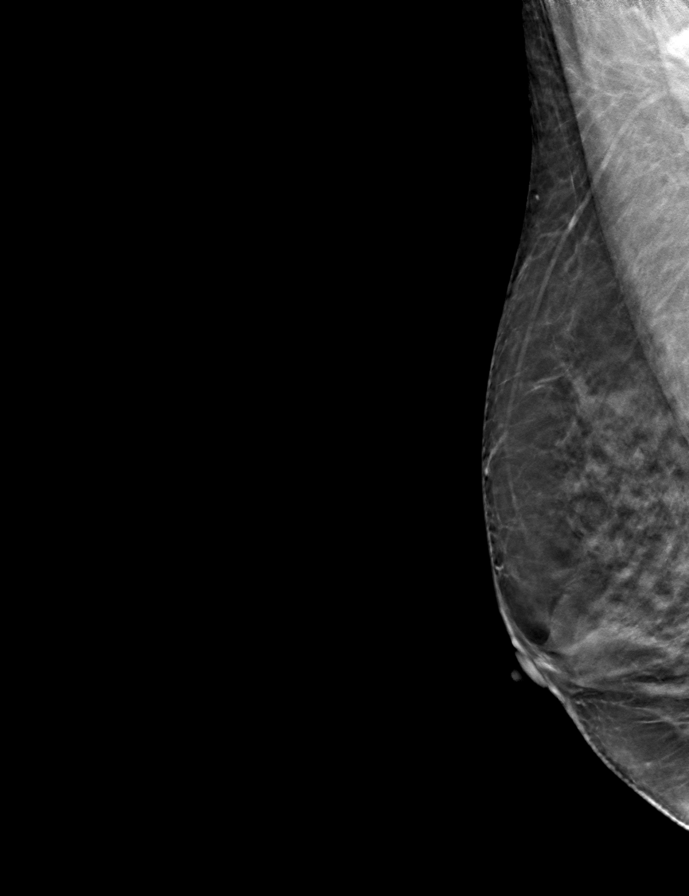
[frame 26/51]
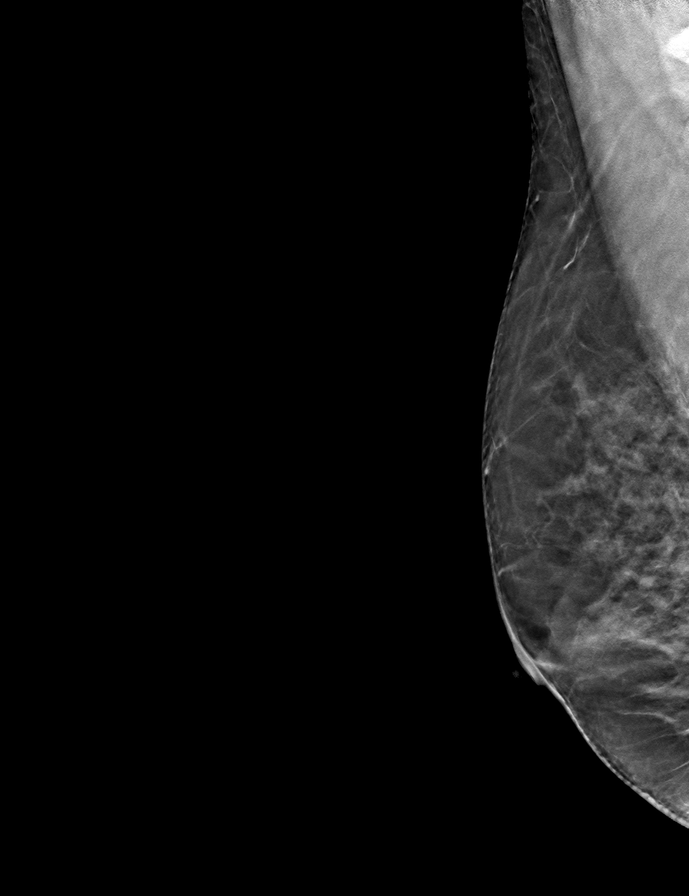

[L MLO tomo · tomo slice 25/49.0]
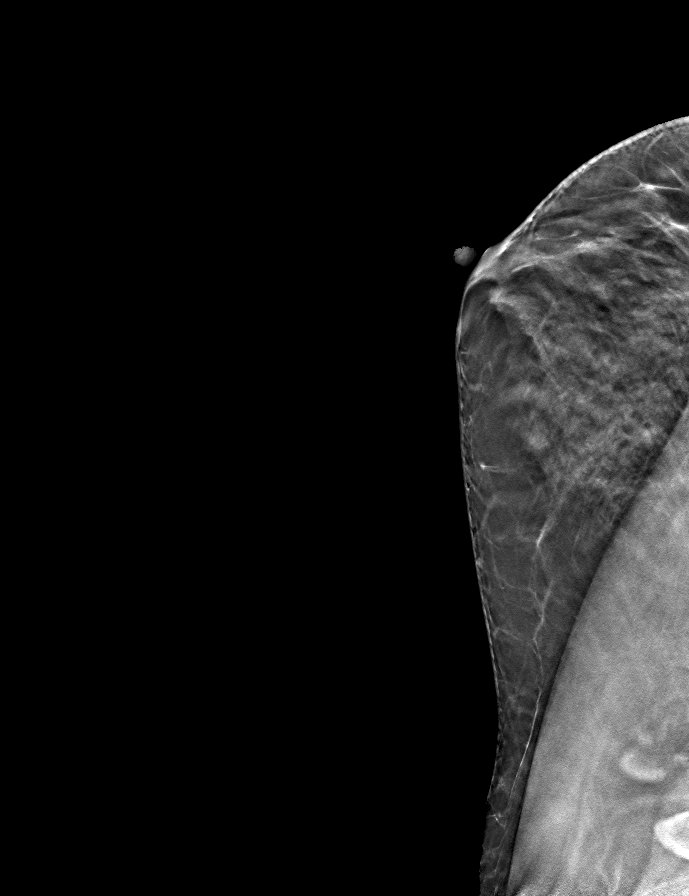

[R CC tomo · tomo slice 27/54.0]
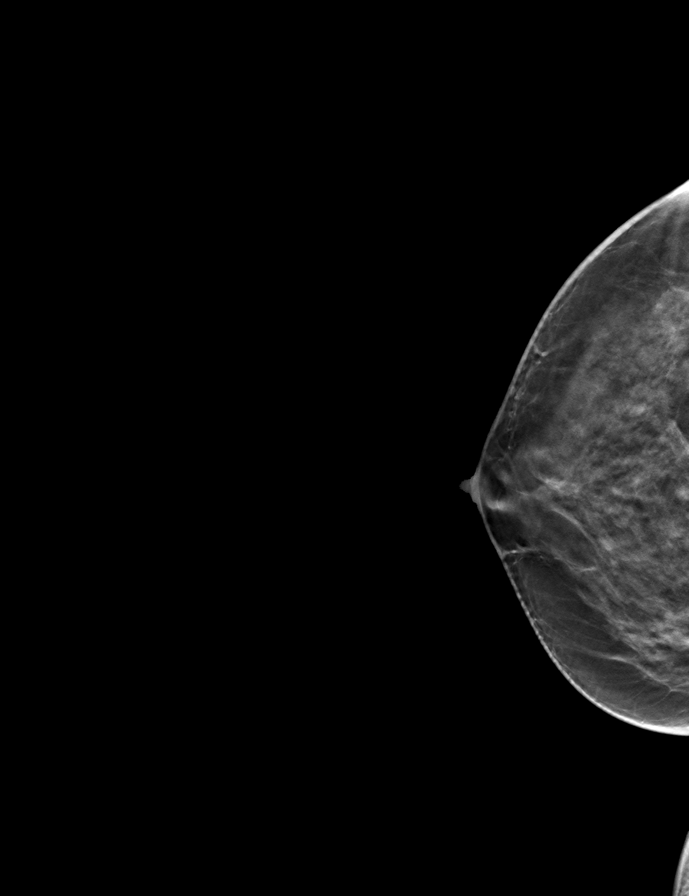

[L CC tomo · tomo slice 27/54.0]
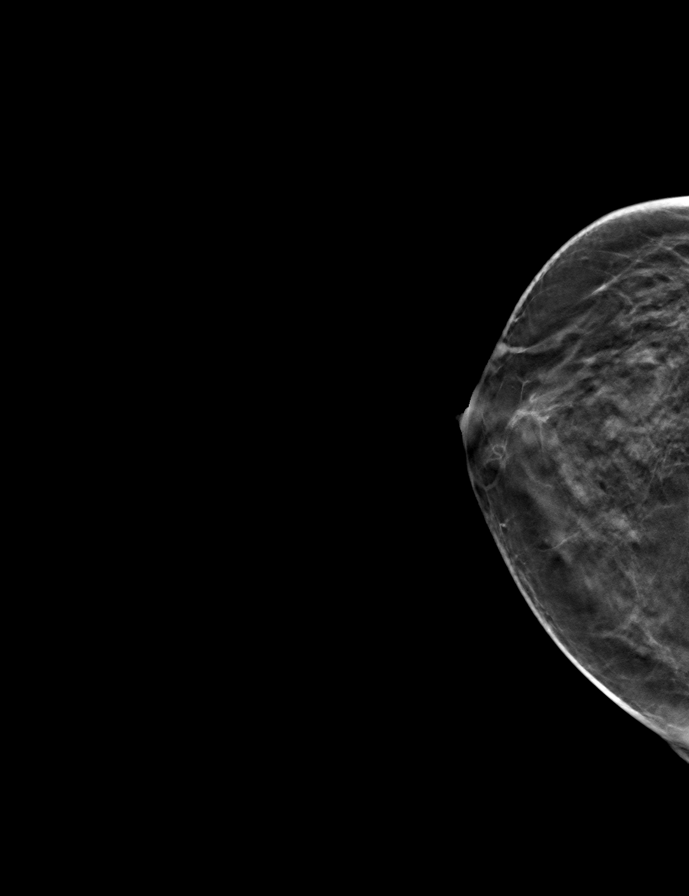

[9 of 24 positions shown; findings below may reference images not displayed]

ACR Breast Density Category c: The breast tissue is heterogeneously
dense, which may obscure small masses.
FINDINGS: There are no findings suspicious for malignancy. Images were
processed with CAD.
IMPRESSION: No mammographic evidence of malignancy. A result letter of this
screening mammogram will be mailed directly to the patient.

RECOMMENDATION:
Screening mammogram in one year. (Code:[SM])

BI-RADS CATEGORY  1: Negative.

## 2015-09-05 DIAGNOSIS — H52222 Regular astigmatism, left eye: Secondary | ICD-10-CM | POA: Diagnosis not present

## 2015-09-05 DIAGNOSIS — H5213 Myopia, bilateral: Secondary | ICD-10-CM | POA: Diagnosis not present

## 2015-09-05 DIAGNOSIS — H1045 Other chronic allergic conjunctivitis: Secondary | ICD-10-CM | POA: Diagnosis not present

## 2015-09-06 ENCOUNTER — Encounter: Payer: Self-pay | Admitting: Obstetrics and Gynecology

## 2015-09-06 ENCOUNTER — Ambulatory Visit (INDEPENDENT_AMBULATORY_CARE_PROVIDER_SITE_OTHER): Payer: 59 | Admitting: Obstetrics and Gynecology

## 2015-09-06 VITALS — BP 140/76 | HR 88 | Resp 14 | Ht 61.0 in | Wt 122.4 lb

## 2015-09-06 DIAGNOSIS — Z0000 Encounter for general adult medical examination without abnormal findings: Secondary | ICD-10-CM

## 2015-09-06 DIAGNOSIS — Z01419 Encounter for gynecological examination (general) (routine) without abnormal findings: Secondary | ICD-10-CM | POA: Diagnosis not present

## 2015-09-06 LAB — COMPREHENSIVE METABOLIC PANEL
ALK PHOS: 43 U/L (ref 33–115)
ALT: 11 U/L (ref 6–29)
AST: 17 U/L (ref 10–30)
Albumin: 4.4 g/dL (ref 3.6–5.1)
BUN: 13 mg/dL (ref 7–25)
CO2: 23 mmol/L (ref 20–31)
CREATININE: 0.75 mg/dL (ref 0.50–1.10)
Calcium: 9 mg/dL (ref 8.6–10.2)
Chloride: 103 mmol/L (ref 98–110)
Glucose, Bld: 85 mg/dL (ref 65–99)
Potassium: 4.2 mmol/L (ref 3.5–5.3)
SODIUM: 139 mmol/L (ref 135–146)
TOTAL PROTEIN: 6.9 g/dL (ref 6.1–8.1)
Total Bilirubin: 0.8 mg/dL (ref 0.2–1.2)

## 2015-09-06 LAB — LIPID PANEL
CHOLESTEROL: 200 mg/dL (ref 125–200)
HDL: 80 mg/dL (ref >=46)
LDL CALC: 106 mg/dL (ref <130)
Total CHOL/HDL Ratio: 2.5 Ratio (ref <=5.0)
Triglycerides: 70 mg/dL (ref <150)
VLDL: 14 mg/dL (ref <30)

## 2015-09-06 LAB — POCT URINALYSIS DIPSTICK
Bilirubin, UA: NEGATIVE
GLUCOSE UA: NEGATIVE
Ketones, UA: NEGATIVE
Leukocytes, UA: NEGATIVE
Nitrite, UA: NEGATIVE
PH UA: 6
Protein, UA: 7
RBC UA: NEGATIVE
Urobilinogen, UA: NEGATIVE

## 2015-09-06 LAB — CBC
HCT: 40.7 % (ref 35.0–45.0)
Hemoglobin: 13.1 g/dL (ref 11.7–15.5)
MCH: 28.6 pg (ref 27.0–33.0)
MCHC: 32.2 g/dL (ref 32.0–36.0)
MCV: 88.9 fL (ref 80.0–100.0)
MPV: 9.6 fL (ref 7.5–12.5)
PLATELETS: 261 10*3/uL (ref 140–400)
RBC: 4.58 MIL/uL (ref 3.80–5.10)
RDW: 13.6 % (ref 11.0–15.0)
WBC: 5.2 10*3/uL (ref 3.8–10.8)

## 2015-09-06 LAB — TSH: TSH: 1.78 m[IU]/L

## 2015-09-06 MED FILL — BEPREVE 1.5% EYE DROPS: 1.5 | 90 days supply | Qty: 10 | Fill #0

## 2015-09-06 NOTE — Progress Notes (Signed)
Patient ID: Alyssa Livingston, female   DOB: 1970/11/10, 45 y.o.   MRN: [id number] 45 y.o. [id number] Divorced Caucasian female here for annual exam.    Menses every 30 days.  No skipped cycles.  No significant flow.  Some back ache with cycles.  BP 129/70 yesterday at eye doctor. BP at home or work 115-118/70s. States she has white coat syndrome.  Wants general labs today.  Declines STD testing.   PCP:  [facility](was Dr.Leschber)   Patient's last menstrual period was 09/04/2015 (exact date).           Sexually active: No. female The current method of family planning is abstinence.    Exercising: Yes.    Cardio, weights and yoga Smoker:  no  Health Maintenance: Pap:  08-11-14 Neg:Neg HR HPV History of abnormal Pap:  no MMG:  11-28-14 3D/Density C/Neg/BiRads1:[facility] Colonoscopy:  n/a BMD:   n/a  Result  n/a TDaP:  05/2011 Gardasil:   N/A Screening Labs:  Hb today: PCP, Urine today: neg   reports that she has never smoked. She has never used smokeless tobacco. She reports that she drinks about 1.2 - 1.8 oz of alcohol per week. She reports that she does not use illicit drugs.  Past Medical History  Diagnosis Date  . Migraines   . Allergic rhinitis, cause unspecified     Past Surgical History  Procedure Laterality Date  . Lipoma excision  03/2010    right flank    Current Outpatient Prescriptions  Medication Sig Dispense Refill  . Calcium Carbonate Antacid (TUMS PO) Take by mouth daily.    . cetirizine (ZYRTEC) 10 MG tablet Take 10 mg by mouth daily.    . fluticasone (FLONASE) 50 MCG/ACT nasal spray Place 2 sprays into the nose as needed for rhinitis. 16 g 6  . Ibuprofen (ADVIL PO) Take by mouth as needed.    . Multiple Vitamins-Minerals (MULTIVITAMIN PO) Take by mouth daily.     No current facility-administered medications for this visit.    Family History  Problem Relation Age of Onset  . Arthritis Mother   . Breast cancer Mother [id number]  . Arthritis  Father   . Stroke Maternal Grandmother   . Prostate cancer Maternal Grandfather     ROS:  Pertinent items are noted in HPI.  Otherwise, a comprehensive ROS was negative.  Exam:   BP 140/76 mmHg  Pulse 88  Resp 14  Ht 5\' 1"  (1.549 m)  Wt 122 lb 6.4 oz (55.52 kg)  BMI 23.14 kg/m2  LMP 09/04/2015 (Exact Date)    General appearance: alert, cooperative and appears stated age Head: Normocephalic, without obvious abnormality, atraumatic Neck: no adenopathy, supple, symmetrical, trachea midline and thyroid normal to inspection and palpation Lungs: clear to auscultation bilaterally Breasts: normal appearance, no masses or tenderness, Inspection negative, No nipple retraction or dimpling, No nipple discharge or bleeding, No axillary or supraclavicular adenopathy Heart: regular rate and rhythm Abdomen: incisions:  No.    , soft, non-tender; no masses, no organomegaly Extremities: extremities normal, atraumatic, no cyanosis or edema Skin: Skin color, texture, turgor normal. No rashes or lesions Lymph nodes: Cervical, supraclavicular, and axillary nodes normal. No abnormal inguinal nodes palpated Neurologic: Grossly normal  Pelvic: External genitalia:  no lesions              Urethra:  normal appearing urethra with no masses, tenderness or lesions  Bartholins and Skenes: normal                 Vagina: normal appearing vagina with normal color and discharge, no lesions              Cervix: no lesions and red blood from the os.              Pap taken: No. Bimanual Exam:  Uterus:  normal size, contour, position, consistency, mobility, non-tender              Adnexa: normal adnexa and no mass, fullness, tenderness              Rectal exam: Yes.  .  Confirms.              Anus:  normal sphincter tone, no lesions  Chaperone was present for exam.  Assessment:   Well woman visit with normal exam. Borderline BP today.   Plan: Yearly mammogram recommended after age 45.  Continue  3D. Recommended self breast exam.  Pap and HR HPV as above. Discussed Calcium, Vitamin D, regular exercise program including cardiovascular and weight bearing exercise. Labs performed.  Yes.  .   See orders.  Routine labs.  Prescription medication(s) given.  No..    Continue to monitor BP at home and work.  She will be establishing care with a new PCP.  Follow up annually and prn.       After visit summary provided.

## 2015-09-06 NOTE — Patient Instructions (Signed)
Health Maintenance, Female Adopting a healthy lifestyle and getting preventive care can go a long way to promote health and wellness. Talk with your health care provider about what schedule of regular examinations is right for you. This is a good chance for you to check in with your provider about disease prevention and staying healthy. In between checkups, there are plenty of things you can do on your own. Experts have done a lot of research about which lifestyle changes and preventive measures are most likely to keep you healthy. Ask your health care provider for more information. WEIGHT AND DIET  Eat a healthy diet  Be sure to include plenty of vegetables, fruits, low-fat dairy products, and lean protein.  Do not eat a lot of foods high in solid fats, added sugars, or salt.  Get regular exercise. This is one of the most important things you can do for your health.  Most adults should exercise for at least 150 minutes each week. The exercise should increase your heart rate and make you sweat (moderate-intensity exercise).  Most adults should also do strengthening exercises at least twice a week. This is in addition to the moderate-intensity exercise.  Maintain a healthy weight  Body mass index (BMI) is a measurement that can be used to identify possible weight problems. It estimates body fat based on height and weight. Your health care provider can help determine your BMI and help you achieve or maintain a healthy weight.  For females 20 years of age and older:   A BMI below 18.5 is considered underweight.  A BMI of 18.5 to 24.9 is normal.  A BMI of 25 to 29.9 is considered overweight.  A BMI of 30 and above is considered obese.  Watch levels of cholesterol and blood lipids  You should start having your blood tested for lipids and cholesterol at 45 years of age, then have this test every 5 years.  You may need to have your cholesterol levels checked more often if:  Your lipid  or cholesterol levels are high.  You are older than 45 years of age.  You are at high risk for heart disease.  CANCER SCREENING   Lung Cancer  Lung cancer screening is recommended for adults 55-80 years old who are at high risk for lung cancer because of a history of smoking.  A yearly low-dose CT scan of the lungs is recommended for people who:  Currently smoke.  Have quit within the past 15 years.  Have at least a 30-pack-year history of smoking. A pack year is smoking an average of one pack of cigarettes a day for 1 year.  Yearly screening should continue until it has been 15 years since you quit.  Yearly screening should stop if you develop a health problem that would prevent you from having lung cancer treatment.  Breast Cancer  Practice breast self-awareness. This means understanding how your breasts normally appear and feel.  It also means doing regular breast self-exams. Let your health care provider know about any changes, no matter how small.  If you are in your 20s or 30s, you should have a clinical breast exam (CBE) by a health care provider every 1-3 years as part of a regular health exam.  If you are 40 or older, have a CBE every year. Also consider having a breast X-ray (mammogram) every year.  If you have a family history of breast cancer, talk to your health care provider about genetic screening.  If you   are at high risk for breast cancer, talk to your health care provider about having an MRI and a mammogram every year.  Breast cancer gene (BRCA) assessment is recommended for women who have family members with BRCA-related cancers. BRCA-related cancers include:  Breast.  Ovarian.  Tubal.  Peritoneal cancers.  Results of the assessment will determine the need for genetic counseling and BRCA1 and BRCA2 testing. Cervical Cancer Your health care provider may recommend that you be screened regularly for cancer of the pelvic organs (ovaries, uterus, and  vagina). This screening involves a pelvic examination, including checking for microscopic changes to the surface of your cervix (Pap test). You may be encouraged to have this screening done every 3 years, beginning at age 21.  For women ages 30-65, health care providers may recommend pelvic exams and Pap testing every 3 years, or they may recommend the Pap and pelvic exam, combined with testing for human papilloma virus (HPV), every 5 years. Some types of HPV increase your risk of cervical cancer. Testing for HPV may also be done on women of any age with unclear Pap test results.  Other health care providers may not recommend any screening for nonpregnant women who are considered low risk for pelvic cancer and who do not have symptoms. Ask your health care provider if a screening pelvic exam is right for you.  If you have had past treatment for cervical cancer or a condition that could lead to cancer, you need Pap tests and screening for cancer for at least 20 years after your treatment. If Pap tests have been discontinued, your risk factors (such as having a new sexual partner) need to be reassessed to determine if screening should resume. Some women have medical problems that increase the chance of getting cervical cancer. In these cases, your health care provider may recommend more frequent screening and Pap tests. Colorectal Cancer  This type of cancer can be detected and often prevented.  Routine colorectal cancer screening usually begins at 45 years of age and continues through 45 years of age.  Your health care provider may recommend screening at an earlier age if you have risk factors for colon cancer.  Your health care provider may also recommend using home test kits to check for hidden blood in the stool.  A small camera at the end of a tube can be used to examine your colon directly (sigmoidoscopy or colonoscopy). This is done to check for the earliest forms of colorectal  cancer.  Routine screening usually begins at age 50.  Direct examination of the colon should be repeated every 5-10 years through 45 years of age. However, you may need to be screened more often if early forms of precancerous polyps or small growths are found. Skin Cancer  Check your skin from head to toe regularly.  Tell your health care provider about any new moles or changes in moles, especially if there is a change in a mole's shape or color.  Also tell your health care provider if you have a mole that is larger than the size of a pencil eraser.  Always use sunscreen. Apply sunscreen liberally and repeatedly throughout the day.  Protect yourself by wearing long sleeves, pants, a wide-brimmed hat, and sunglasses whenever you are outside. HEART DISEASE, DIABETES, AND HIGH BLOOD PRESSURE   High blood pressure causes heart disease and increases the risk of stroke. High blood pressure is more likely to develop in:  People who have blood pressure in the high end   of the normal range (130-139/85-89 mm Hg).  People who are overweight or obese.  People who are African American.  If you are 38-23 years of age, have your blood pressure checked every 3-5 years. If you are 61 years of age or older, have your blood pressure checked every year. You should have your blood pressure measured twice--once when you are at a hospital or clinic, and once when you are not at a hospital or clinic. Record the average of the two measurements. To check your blood pressure when you are not at a hospital or clinic, you can use:  An automated blood pressure machine at a pharmacy.  A home blood pressure monitor.  If you are between 45 years and 39 years old, ask your health care provider if you should take aspirin to prevent strokes.  Have regular diabetes screenings. This involves taking a blood sample to check your fasting blood sugar level.  If you are at a normal weight and have a low risk for diabetes,  have this test once every three years after 45 years of age.  If you are overweight and have a high risk for diabetes, consider being tested at a younger age or more often. PREVENTING INFECTION  Hepatitis B  If you have a higher risk for hepatitis B, you should be screened for this virus. You are considered at high risk for hepatitis B if:  You were born in a country where hepatitis B is common. Ask your health care provider which countries are considered high risk.  Your parents were born in a high-risk country, and you have not been immunized against hepatitis B (hepatitis B vaccine).  You have HIV or AIDS.  You use needles to inject street drugs.  You live with someone who has hepatitis B.  You have had sex with someone who has hepatitis B.  You get hemodialysis treatment.  You take certain medicines for conditions, including cancer, organ transplantation, and autoimmune conditions. Hepatitis C  Blood testing is recommended for:  Everyone born from [birth year] through 1965.  Anyone with known risk factors for hepatitis C. Sexually transmitted infections (STIs)  You should be screened for sexually transmitted infections (STIs) including gonorrhea and chlamydia if:  You are sexually active and are younger than 45 years of age.  You are older than 45 years of age and your health care provider tells you that you are at risk for this type of infection.  Your sexual activity has changed since you were last screened and you are at an increased risk for chlamydia or gonorrhea. Ask your health care provider if you are at risk.  If you do not have HIV, but are at risk, it may be recommended that you take a prescription medicine daily to prevent HIV infection. This is called pre-exposure prophylaxis (PrEP). You are considered at risk if:  You are sexually active and do not regularly use condoms or know the HIV status of your partner(s).  You take drugs by injection.  You are sexually  active with a partner who has HIV. Talk with your health care provider about whether you are at high risk of being infected with HIV. If you choose to begin PrEP, you should first be tested for HIV. You should then be tested every 3 months for as long as you are taking PrEP.  PREGNANCY   If you are premenopausal and you may become pregnant, ask your health care provider about preconception counseling.  If you may  become pregnant, take 400 to 800 micrograms (mcg) of folic acid every day.  If you want to prevent pregnancy, talk to your health care provider about birth control (contraception). OSTEOPOROSIS AND MENOPAUSE   Osteoporosis is a disease in which the bones lose minerals and strength with aging. This can result in serious bone fractures. Your risk for osteoporosis can be identified using a bone density scan.  If you are 61 years of age or older, or if you are at risk for osteoporosis and fractures, ask your health care provider if you should be screened.  Ask your health care provider whether you should take a calcium or vitamin D supplement to lower your risk for osteoporosis.  Menopause may have certain physical symptoms and risks.  Hormone replacement therapy may reduce some of these symptoms and risks. Talk to your health care provider about whether hormone replacement therapy is right for you.  HOME CARE INSTRUCTIONS   Schedule regular health, dental, and eye exams.  Stay current with your immunizations.   Do not use any tobacco products including cigarettes, chewing tobacco, or electronic cigarettes.  If you are pregnant, do not drink alcohol.  If you are breastfeeding, limit how much and how often you drink alcohol.  Limit alcohol intake to no more than 1 drink per day for nonpregnant women. One drink equals 12 ounces of beer, 5 ounces of wine, or 1 ounces of hard liquor.  Do not use street drugs.  Do not share needles.  Ask your health care provider for help if  you need support or information about quitting drugs.  Tell your health care provider if you often feel depressed.  Tell your health care provider if you have ever been abused or do not feel safe at home.   This information is not intended to replace advice given to you by your health care provider. Make sure you discuss any questions you have with your health care provider.   Document Released: 09/15/2010 Document Revised: 03/23/2014 Document Reviewed: 02/01/2013 Elsevier Interactive Patient Education [employer].

## 2015-09-07 LAB — VITAMIN D 25 HYDROXY (VIT D DEFICIENCY, FRACTURES): Vit D, 25-Hydroxy: 29 ng/mL — ABNORMAL LOW (ref 30–100)

## 2015-10-31 ENCOUNTER — Other Ambulatory Visit: Payer: Self-pay | Admitting: Obstetrics and Gynecology

## 2015-10-31 DIAGNOSIS — Z1231 Encounter for screening mammogram for malignant neoplasm of breast: Secondary | ICD-10-CM

## 2015-11-29 ENCOUNTER — Ambulatory Visit
Admission: RE | Admit: 2015-11-29 | Discharge: 2015-11-29 | Disposition: A | Discharge status: Home / Self Care | Payer: 59 | Source: Ambulatory Visit | Attending: Obstetrics and Gynecology | Admitting: Obstetrics and Gynecology

## 2015-11-29 DIAGNOSIS — Z1231 Encounter for screening mammogram for malignant neoplasm of breast: Secondary | ICD-10-CM

## 2015-11-29 IMAGING — MG 2D DIGITAL SCREENING BILATERAL MAMMOGRAM WITH CAD AND ADJUNCT TO
9 of 12 series · 9 of 28 positions shown · non-contrast
Comparison: Previous exam(s).

CLINICAL DATA: Screening.

EXAM:
2D DIGITAL SCREENING BILATERAL MAMMOGRAM WITH CAD AND ADJUNCT TOMO

[R CC synth-2D]
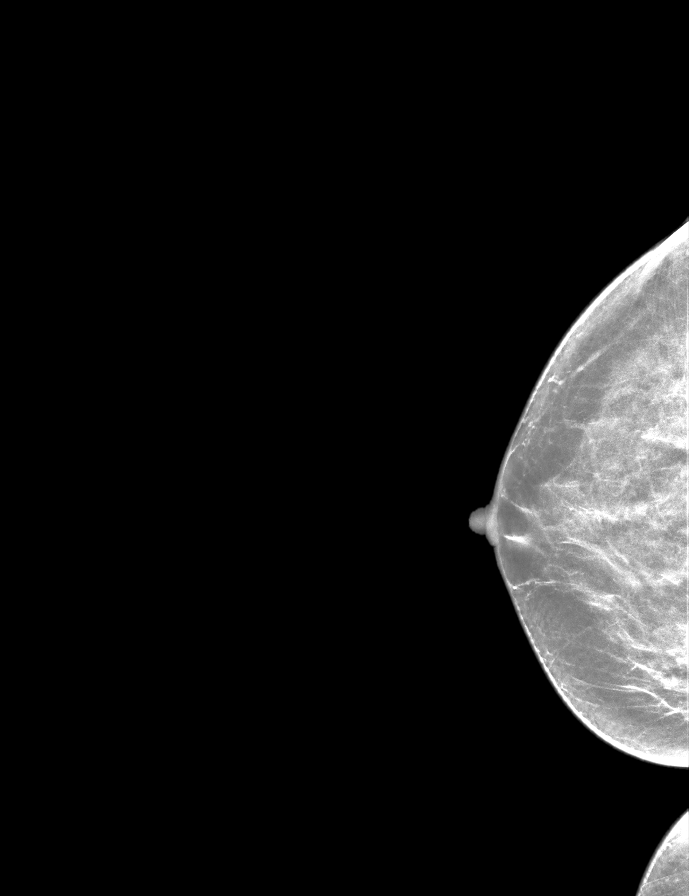

[R MLO]
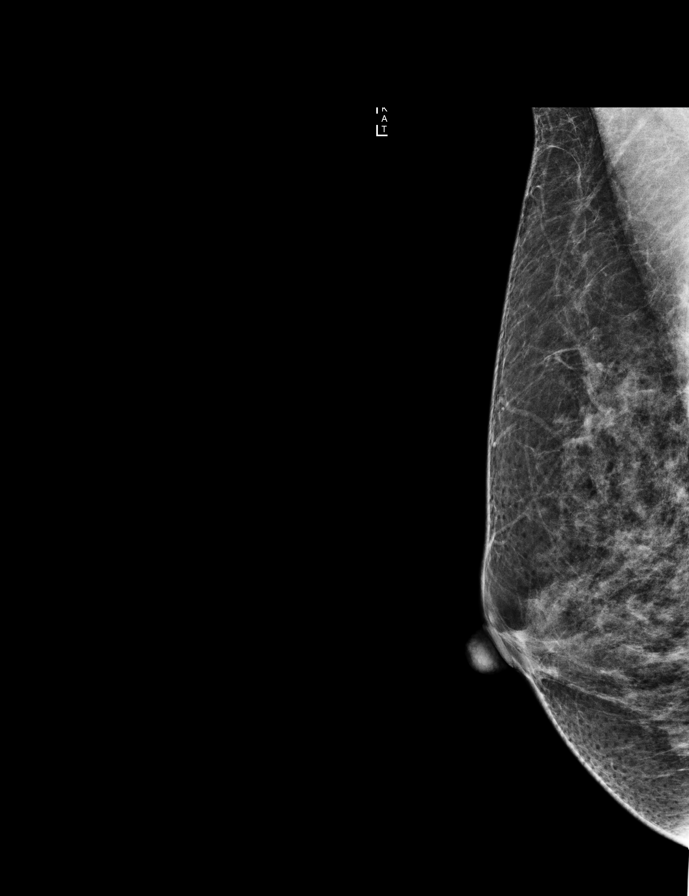

[R CC]
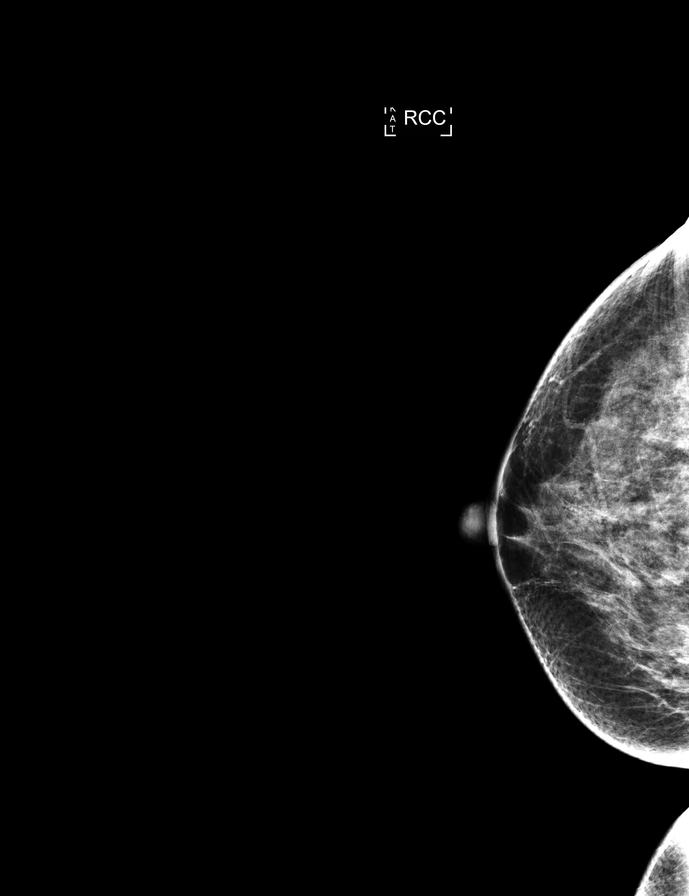

[R MLO synth-2D]
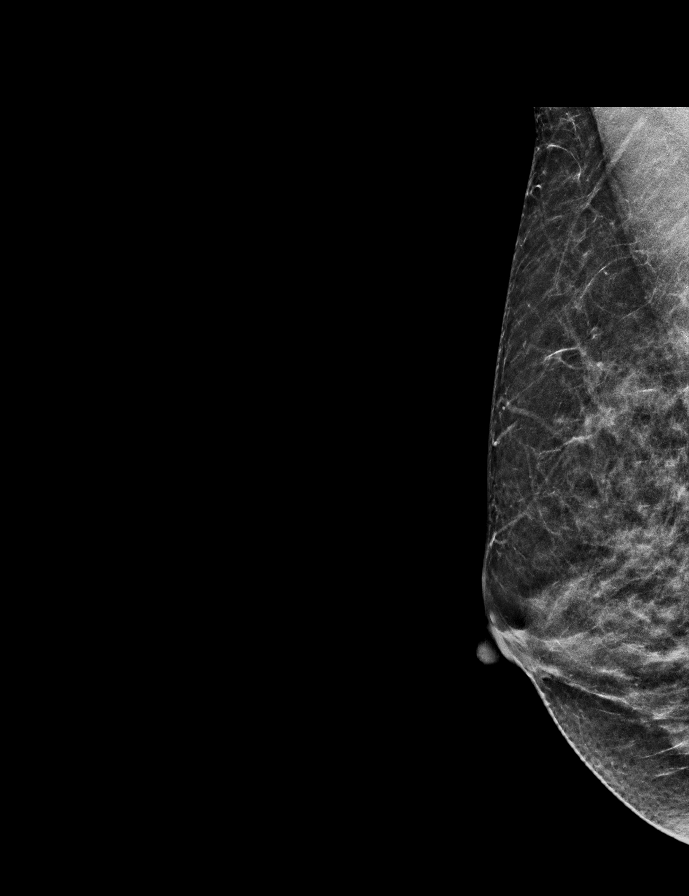

[L MLO synth-2D]
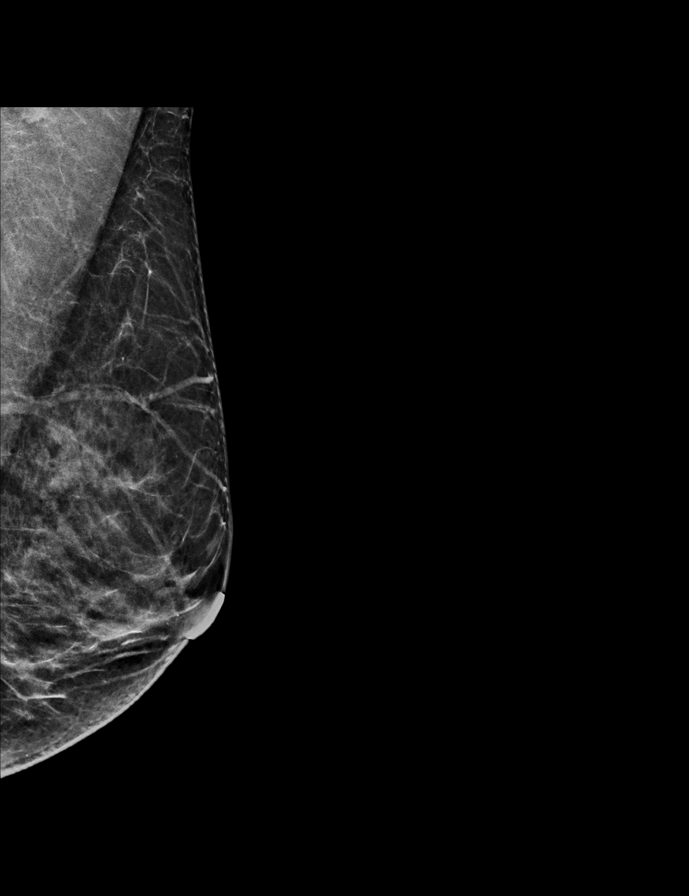

[L CC]
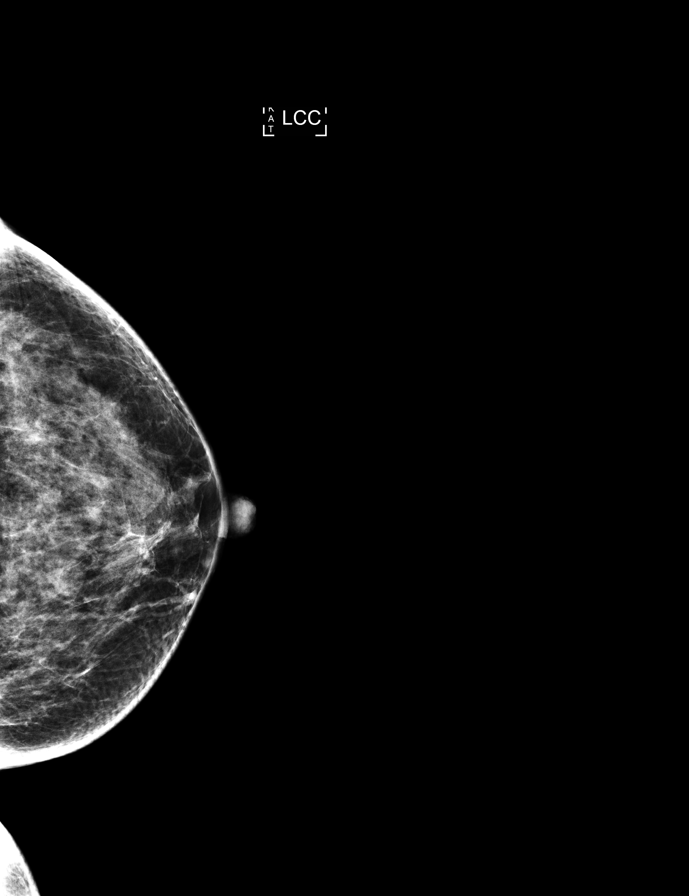

[L MLO]
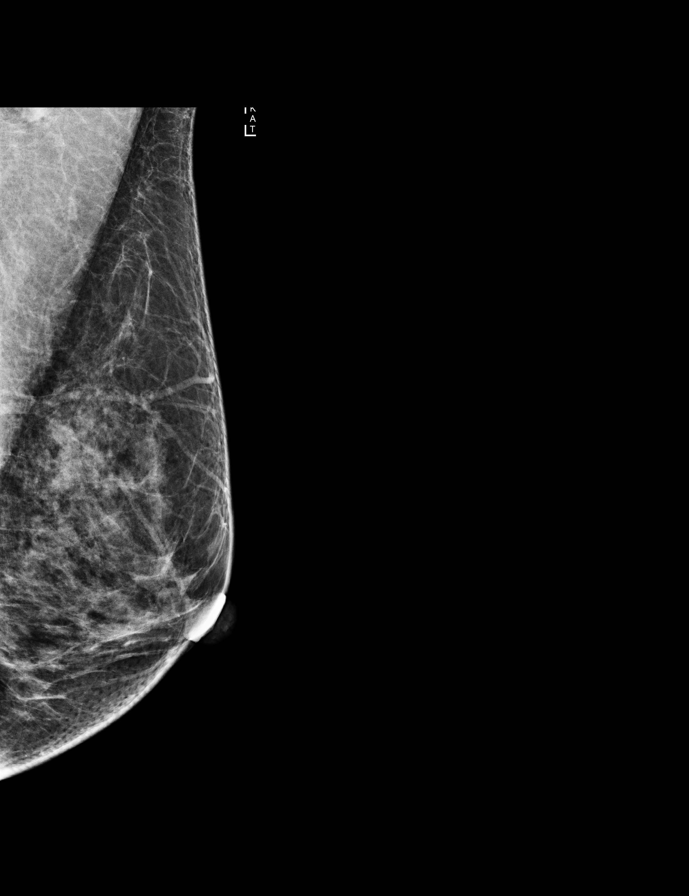

[L CC synth-2D]
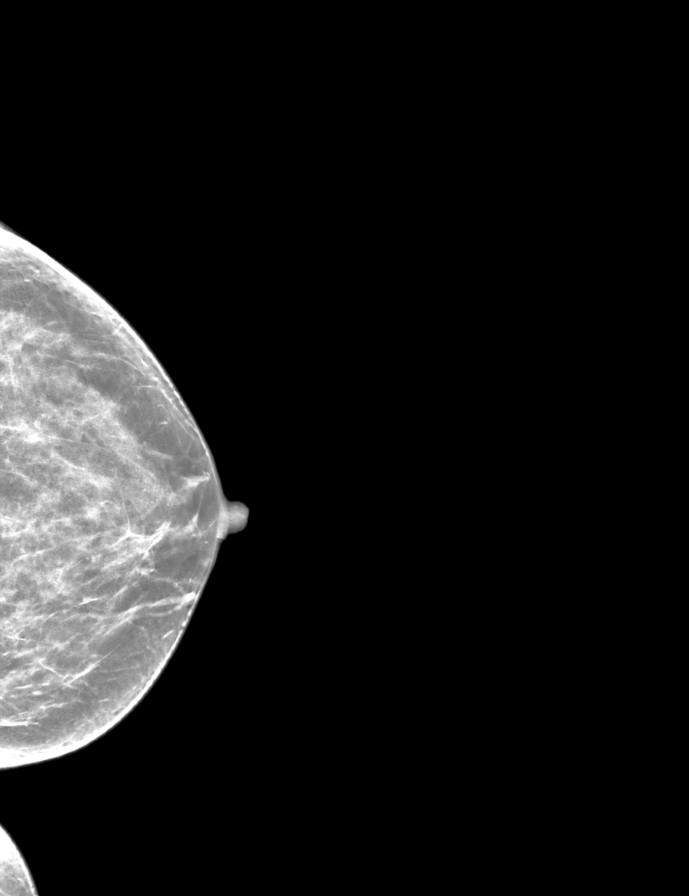

[L CC tomo · tomo slice 27/53.0]
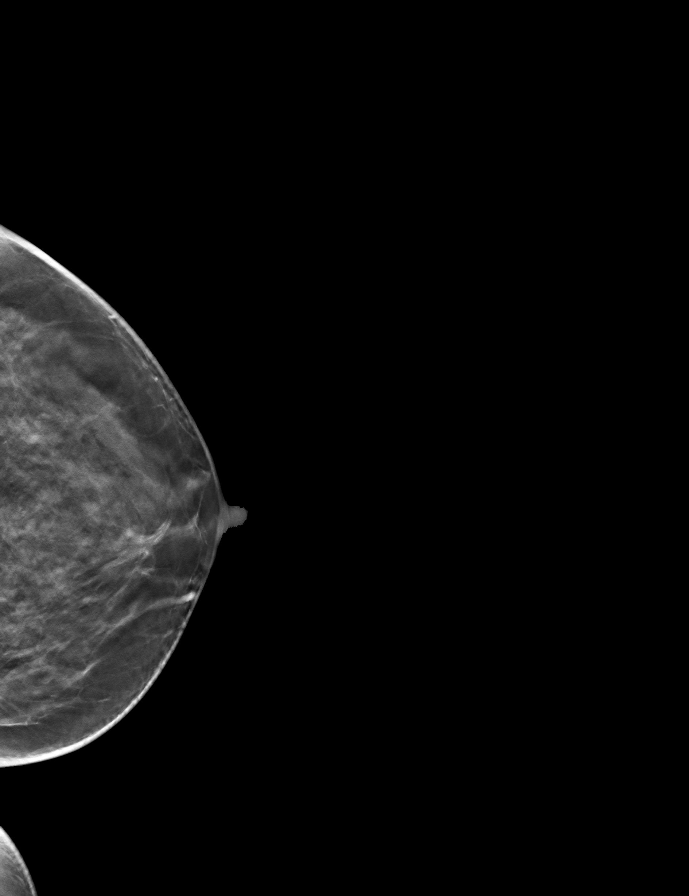

[9 of 28 positions shown; findings below may reference images not displayed]

ACR Breast Density Category c: The breast tissue is heterogeneously
dense, which may obscure small masses.
FINDINGS: There are no findings suspicious for malignancy. Images were
processed with CAD.
IMPRESSION: No mammographic evidence of malignancy. A result letter of this
screening mammogram will be mailed directly to the patient.

RECOMMENDATION:
Screening mammogram in one year. (Code:[TA])

BI-RADS CATEGORY  1: Negative.

## 2016-01-06 MED FILL — BEPREVE 1.5% EYE DROPS: 1.5 | 90 days supply | Qty: 10 | Fill #1

## 2016-08-31 MED FILL — BEPREVE 1.5% EYE DROPS: 1.5 | 90 days supply | Qty: 10 | Fill #2

## 2016-09-11 ENCOUNTER — Other Ambulatory Visit (HOSPITAL_COMMUNITY)
Admission: RE | Admit: 2016-09-11 | Discharge: 2016-09-11 | Disposition: A | Discharge status: Home / Self Care | Payer: 59 | Source: Ambulatory Visit | Attending: Obstetrics and Gynecology | Admitting: Obstetrics and Gynecology

## 2016-09-11 ENCOUNTER — Ambulatory Visit (INDEPENDENT_AMBULATORY_CARE_PROVIDER_SITE_OTHER): Payer: 59 | Admitting: Obstetrics and Gynecology

## 2016-09-11 ENCOUNTER — Encounter: Payer: Self-pay | Admitting: Obstetrics and Gynecology

## 2016-09-11 VITALS — BP 142/76 | HR 64 | Resp 16 | Ht 61.0 in | Wt 121.0 lb

## 2016-09-11 DIAGNOSIS — Z01419 Encounter for gynecological examination (general) (routine) without abnormal findings: Secondary | ICD-10-CM

## 2016-09-11 DIAGNOSIS — Z8042 Family history of malignant neoplasm of prostate: Secondary | ICD-10-CM

## 2016-09-11 DIAGNOSIS — Z803 Family history of malignant neoplasm of breast: Secondary | ICD-10-CM

## 2016-09-11 NOTE — Progress Notes (Signed)
46 y.o. 722P1011 Divorced Caucasian female here for annual exam.    Last menses was 2 months from the last one.  Feels hot in general.   Check PB at home and it is normal. Levels are usually 113/70s at home.   States she has white coat syndrome.   PCP:   No PCP per patient   Patient's last menstrual period was 09/04/2016.           Sexually active: No.  The current method of family planning is abstinence.    Exercising: Yes.    yoga, weight training, cardio, pilates Smoker:  no  Health Maintenance: Pap:  08-10-13 Neg:Neg HR HPV History of abnormal Pap:  no MMG:  11/29/15 BIRADS 1 negative/density c Colonoscopy:  n/a BMD:   n/a  Result  n/a TDaP:  05/2011 HIV: possible with pregnancy Screening Labs: discuss today   reports that she has never smoked. She has never used smokeless tobacco. She reports that she drinks about 1.2 - 1.8 oz of alcohol per week . She reports that she does not use drugs.  Past Medical History:  Diagnosis Date  . Allergic rhinitis, cause unspecified   . Migraines     Past Surgical History:  Procedure Laterality Date  . LIPOMA EXCISION  03/2010   right flank    Current Outpatient Prescriptions  Medication Sig Dispense Refill  . BEPREVE 1.5 % SOLN   4  . Calcium Carbonate Antacid (TUMS PO) Take by mouth daily.    . Calcium Citrate-Vitamin D (CALCIUM + D PO) Take by mouth.    . cetirizine (ZYRTEC) 10 MG tablet Take 10 mg by mouth daily.    . fluticasone (FLONASE) 50 MCG/ACT nasal spray Place 2 sprays into the nose as needed for rhinitis. 16 g 6  . Ibuprofen (ADVIL PO) Take by mouth as needed.    . Multiple Vitamins-Minerals (MULTIVITAMIN PO) Take by mouth daily.     No current facility-administered medications for this visit.     Family History  Problem Relation Age of Onset  . Arthritis Mother   . Breast cancer Mother [id number]  . Prostate cancer Maternal Grandfather   . Arthritis Maternal Grandfather   . Arthritis Maternal Grandmother   .  Stroke Other     ROS:  Pertinent items are noted in HPI.  Otherwise, a comprehensive ROS was negative.  Exam:   BP (!) 142/76 (BP Location: Right Arm, Patient Position: Sitting, Cuff Size: Normal)   Pulse 64   Resp 16   Ht 5\' 1"  (1.549 m)   Wt 121 lb (54.9 kg)   LMP 09/04/2016   BMI 22.86 kg/m     General appearance: alert, cooperative and appears stated age Head: Normocephalic, without obvious abnormality, atraumatic Neck: no adenopathy, supple, symmetrical, trachea midline and thyroid normal to inspection and palpation Lungs: clear to auscultation bilaterally Breasts: normal appearance, no masses or tenderness, No nipple retraction or dimpling, No nipple discharge or bleeding, No axillary or supraclavicular adenopathy Heart: regular rate and rhythm Abdomen: soft, non-tender; no masses, no organomegaly Extremities: extremities normal, atraumatic, no cyanosis or edema Skin: Skin color, texture, turgor normal. No rashes or lesions Lymph nodes: Cervical, supraclavicular, and axillary nodes normal. No abnormal inguinal nodes palpated Neurologic: Grossly normal  Pelvic: External genitalia:  no lesions              Urethra:  normal appearing urethra with no masses, tenderness or lesions  Bartholins and Skenes: normal                 Vagina: normal appearing vagina with normal color and discharge, no lesions              Cervix: no lesions              Pap taken: Yes.   Bimanual Exam:  Uterus:  normal size, contour, position, consistency, mobility, non-tender              Adnexa: no mass, fullness, tenderness              Rectal exam: Yes.  .  Confirms.              Anus:  normal sphincter tone, no lesions  Chaperone was present for exam.  Assessment:   Well woman visit with normal exam. Borderline BP elevation. FH breast cancer.  Mildly low vit D last year.  Mother and maternal grandmother with breast cancer.  Mother tested negative twice for genetic mutation.   Family history of prostate cancer.  Plan: Mammogram screening discussed. Recommended self breast awareness. Pap and HR HPV as above. Guidelines for Calcium, Vitamin D, regular exercise program including cardiovascular and weight bearing exercise. Discussed colonoscopy.  Routine labs.  She will establish care with PCP.  Continue to monitor BP at home.  We will refer genetics counselor for counseling and possible testing.  Patient may benefit from yearly MRI of the breast.  Follow up annually and prn.    After visit summary provided.

## 2016-09-11 NOTE — Patient Instructions (Signed)

## 2016-09-12 LAB — COMPREHENSIVE METABOLIC PANEL
A/G RATIO: 1.9 (ref 1.2–2.2)
ALT: 11 IU/L (ref 0–32)
AST: 19 IU/L (ref 0–40)
Albumin: 4.4 g/dL (ref 3.5–5.5)
Alkaline Phosphatase: 50 IU/L (ref 39–117)
BUN/Creatinine Ratio: 24 — ABNORMAL HIGH (ref 9–23)
BUN: 16 mg/dL (ref 6–24)
Bilirubin Total: 1 mg/dL (ref 0.0–1.2)
CALCIUM: 9.2 mg/dL (ref 8.7–10.2)
CO2: 25 mmol/L (ref 20–29)
Chloride: 105 mmol/L (ref 96–106)
Creatinine, Ser: 0.67 mg/dL (ref 0.57–1.00)
GFR calc Af Amer: 123 mL/min/{1.73_m2} (ref >59)
GFR, EST NON AFRICAN AMERICAN: 106 mL/min/{1.73_m2} (ref >59)
GLUCOSE: 87 mg/dL (ref 65–99)
Globulin, Total: 2.3 g/dL (ref 1.5–4.5)
POTASSIUM: 4.6 mmol/L (ref 3.5–5.2)
Sodium: 143 mmol/L (ref 134–144)
Total Protein: 6.7 g/dL (ref 6.0–8.5)

## 2016-09-12 LAB — LIPID PANEL
CHOL/HDL RATIO: 2.2 ratio (ref 0.0–4.4)
Cholesterol, Total: 188 mg/dL (ref 100–199)
HDL: 84 mg/dL (ref >39)
LDL CALC: 91 mg/dL (ref 0–99)
TRIGLYCERIDES: 64 mg/dL (ref 0–149)
VLDL Cholesterol Cal: 13 mg/dL (ref 5–40)

## 2016-09-12 LAB — CBC
HEMOGLOBIN: 12.8 g/dL (ref 11.1–15.9)
Hematocrit: 40.1 % (ref 34.0–46.6)
MCH: 28.8 pg (ref 26.6–33.0)
MCHC: 31.9 g/dL (ref 31.5–35.7)
MCV: 90 fL (ref 79–97)
Platelets: 249 10*3/uL (ref 150–379)
RBC: 4.44 x10E6/uL (ref 3.77–5.28)
RDW: 13.7 % (ref 12.3–15.4)
WBC: 4.7 10*3/uL (ref 3.4–10.8)

## 2016-09-12 LAB — VITAMIN D 25 HYDROXY (VIT D DEFICIENCY, FRACTURES): VIT D 25 HYDROXY: 31.6 ng/mL (ref 30.0–100.0)

## 2016-09-15 LAB — CYTOLOGY - PAP
ADEQUACY: ABSENT
DIAGNOSIS: NEGATIVE
HPV (WINDOPATH): NOT DETECTED

## 2016-09-25 ENCOUNTER — Telehealth: Payer: Self-pay | Admitting: Obstetrics and Gynecology

## 2016-09-25 NOTE — Telephone Encounter (Signed)
Spoke with patient. Provided contact information. She is agreeable to follow up with their office for scheduling.   Routing to provider for review. Will close encounter.

## 2016-09-25 NOTE — Telephone Encounter (Signed)
Call to patient to provide number for genetics scheduling, [phone]. Left voicemail for return call.

## 2016-10-28 ENCOUNTER — Other Ambulatory Visit: Payer: Self-pay | Admitting: Obstetrics and Gynecology

## 2016-10-28 DIAGNOSIS — Z1231 Encounter for screening mammogram for malignant neoplasm of breast: Secondary | ICD-10-CM

## 2016-11-19 DIAGNOSIS — S93401A Sprain of unspecified ligament of right ankle, initial encounter: Secondary | ICD-10-CM | POA: Diagnosis not present

## 2016-11-25 DIAGNOSIS — H5213 Myopia, bilateral: Secondary | ICD-10-CM | POA: Diagnosis not present

## 2016-11-30 ENCOUNTER — Ambulatory Visit
Admission: RE | Admit: 2016-11-30 | Discharge: 2016-11-30 | Disposition: A | Discharge status: Home / Self Care | Payer: 59 | Source: Ambulatory Visit | Attending: Obstetrics and Gynecology | Admitting: Obstetrics and Gynecology

## 2016-11-30 DIAGNOSIS — Z1231 Encounter for screening mammogram for malignant neoplasm of breast: Secondary | ICD-10-CM | POA: Diagnosis not present

## 2016-11-30 IMAGING — MG 2D DIGITAL SCREENING BILATERAL MAMMOGRAM WITH CAD AND ADJUNCT TO
9 of 12 series · 9 of 28 positions shown · non-contrast
Comparison: Previous exam(s).

CLINICAL DATA: Screening.

EXAM:
2D DIGITAL SCREENING BILATERAL MAMMOGRAM WITH CAD AND ADJUNCT TOMO

[R MLO]
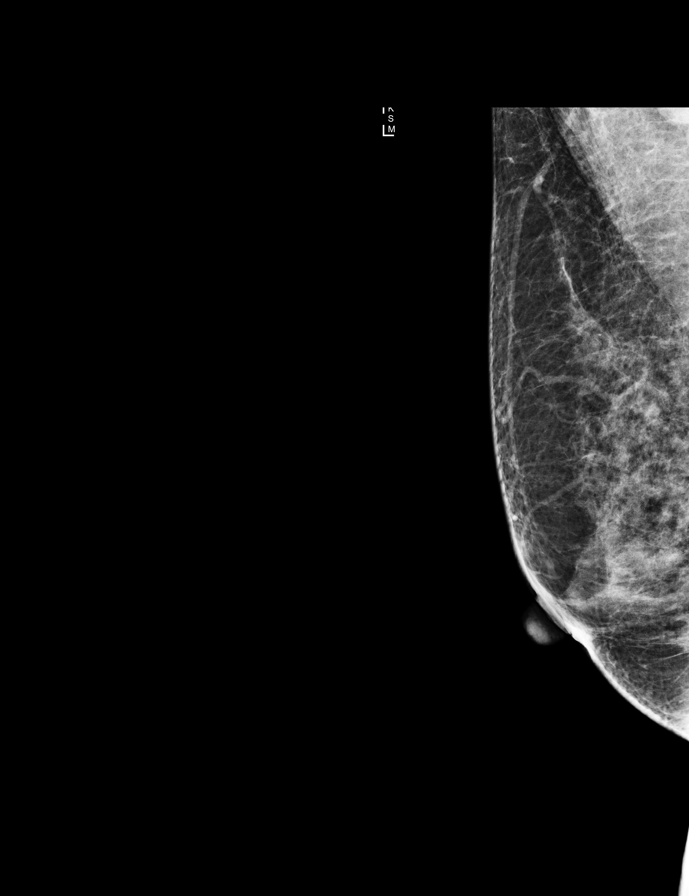

[R CC synth-2D]
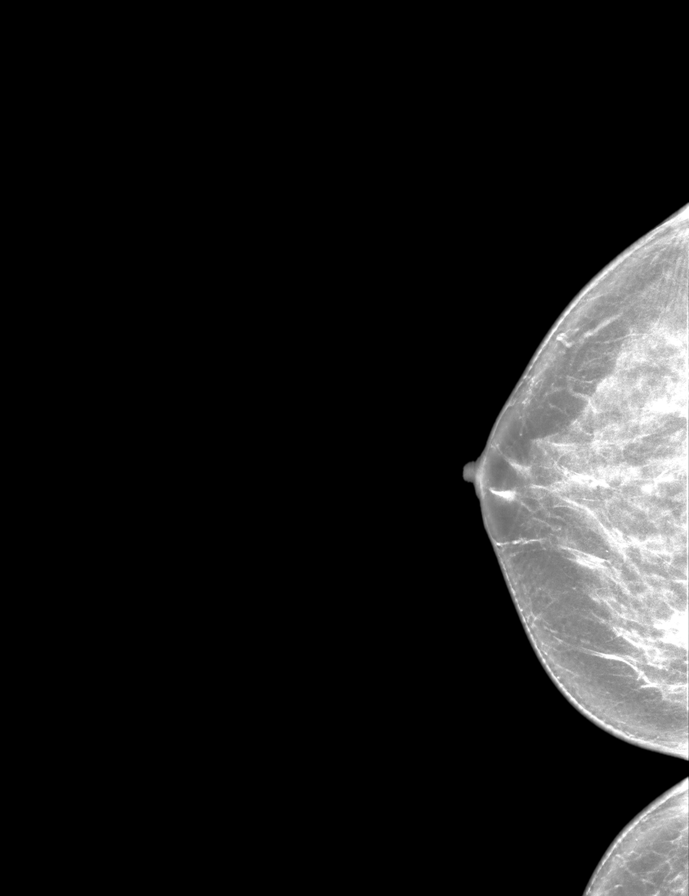

[L CC synth-2D]
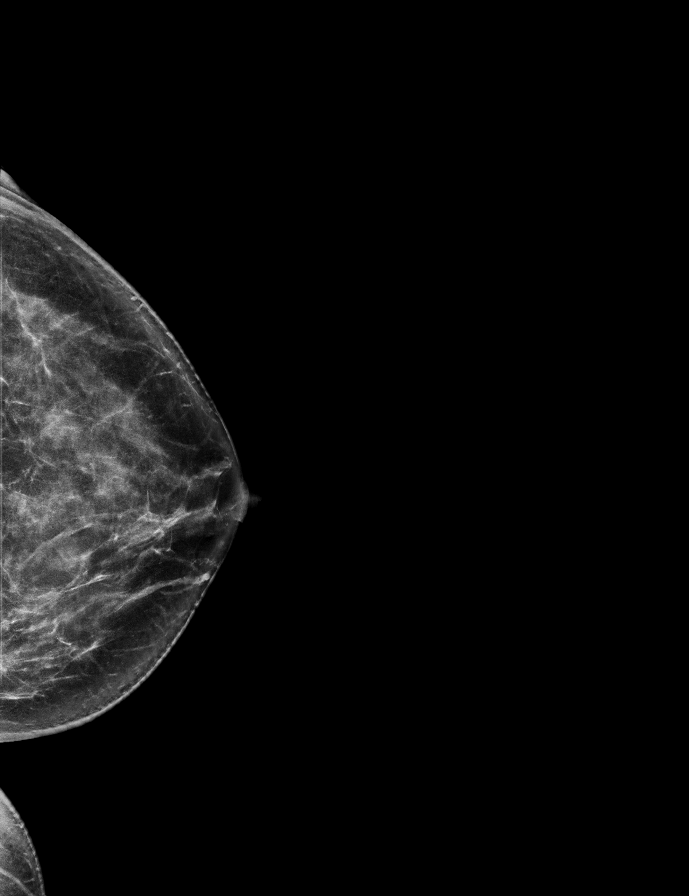

[L MLO]
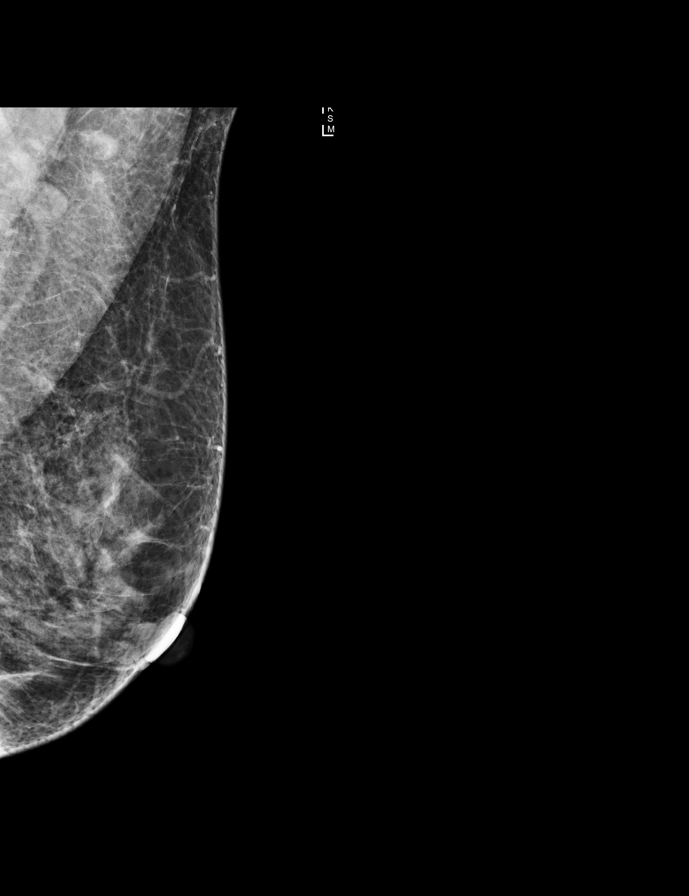

[R MLO synth-2D]
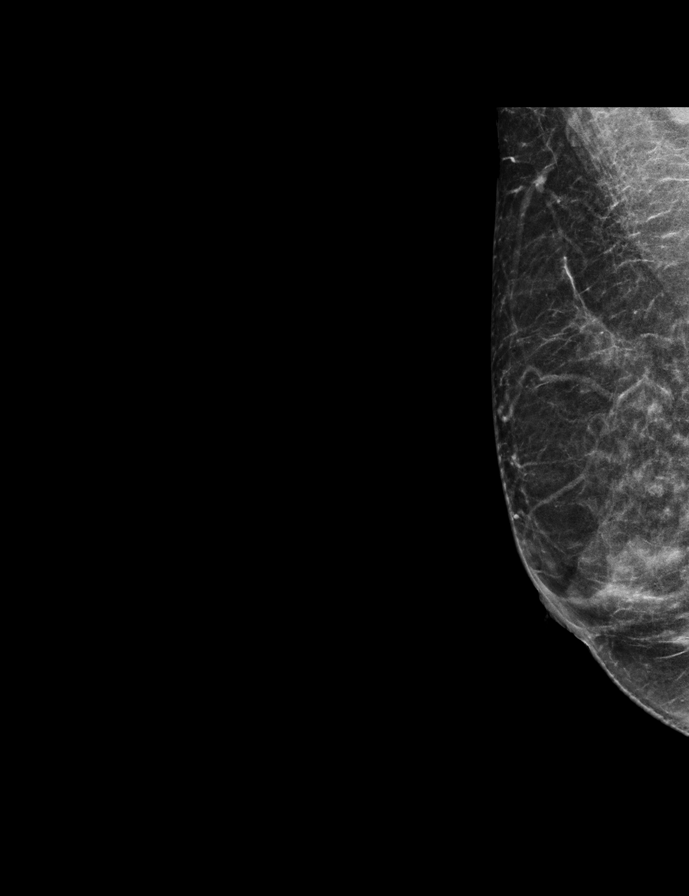

[R CC]
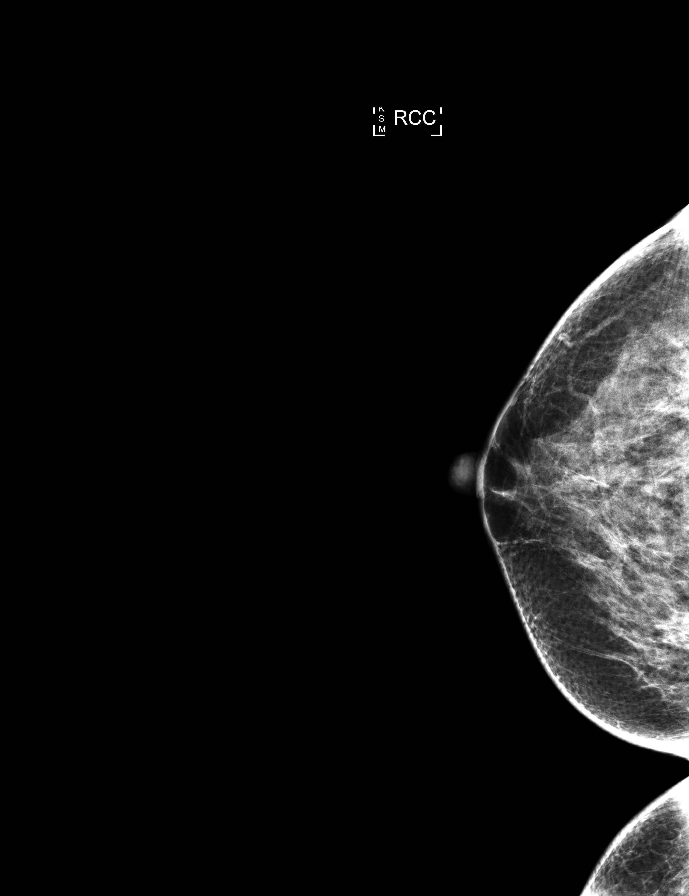

[L CC]
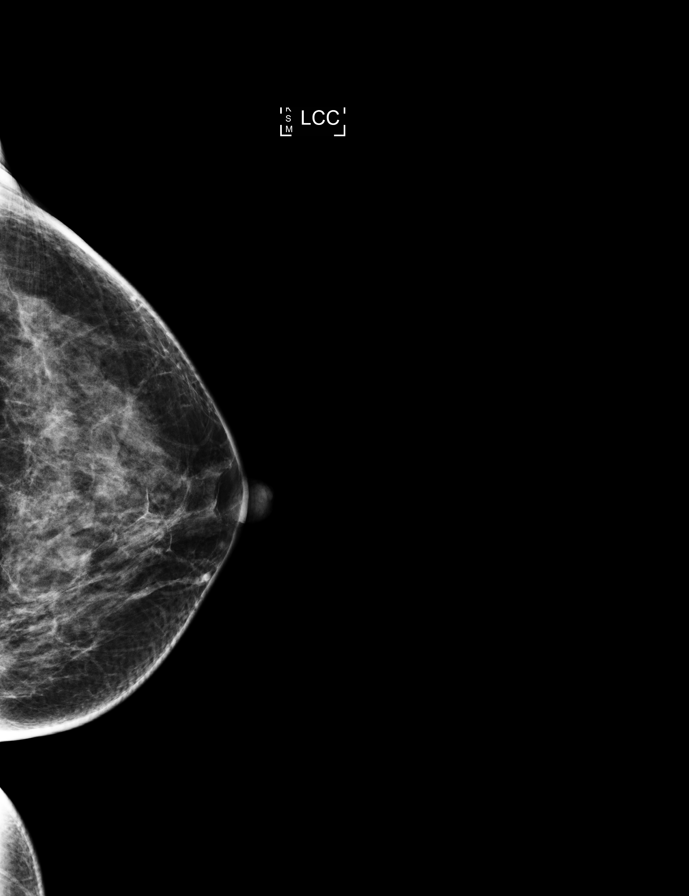

[L MLO synth-2D]
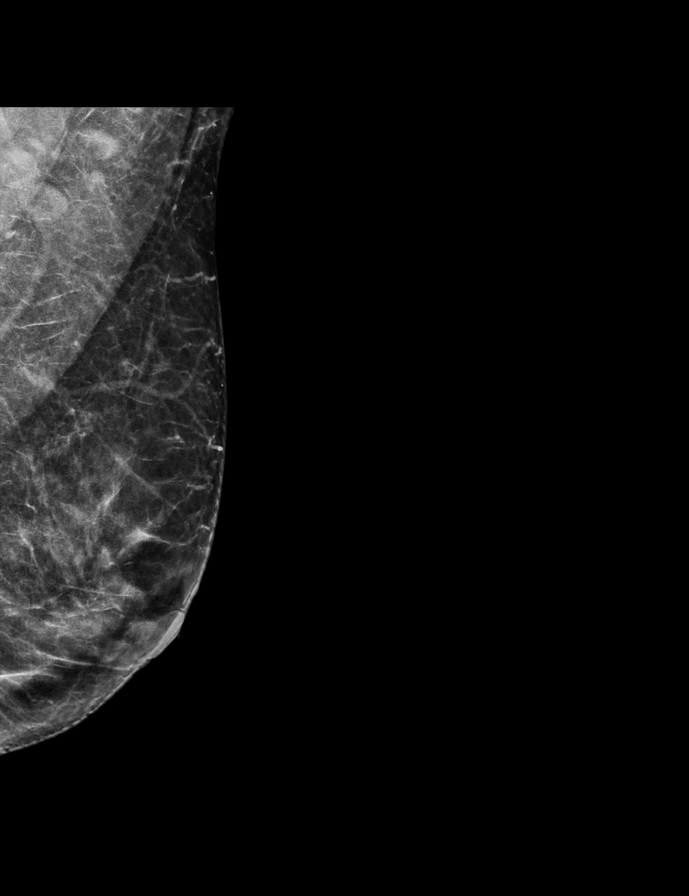

[L CC tomo · tomo slice 32/63.0]
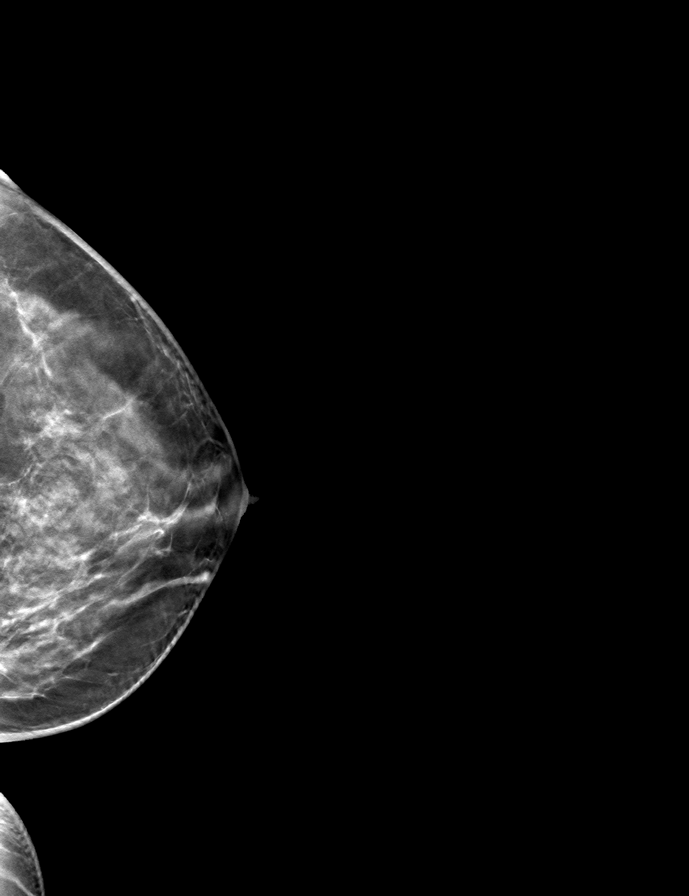

[9 of 28 positions shown; findings below may reference images not displayed]

ACR Breast Density Category c: The breast tissue is heterogeneously
dense, which may obscure small masses.
FINDINGS: There are no findings suspicious for malignancy. Images were
processed with CAD.
IMPRESSION: No mammographic evidence of malignancy. A result letter of this
screening mammogram will be mailed directly to the patient.

RECOMMENDATION:
Screening mammogram in one year. (Code:[TA])

BI-RADS CATEGORY  1: Negative.

## 2017-09-13 ENCOUNTER — Encounter: Payer: Self-pay | Admitting: Obstetrics and Gynecology

## 2017-09-13 ENCOUNTER — Other Ambulatory Visit: Payer: Self-pay

## 2017-09-13 ENCOUNTER — Ambulatory Visit (INDEPENDENT_AMBULATORY_CARE_PROVIDER_SITE_OTHER): Payer: 59 | Admitting: Obstetrics and Gynecology

## 2017-09-13 VITALS — BP 146/82 | HR 86 | Resp 14 | Ht 61.0 in | Wt 122.0 lb

## 2017-09-13 DIAGNOSIS — Z01419 Encounter for gynecological examination (general) (routine) without abnormal findings: Secondary | ICD-10-CM

## 2017-09-13 DIAGNOSIS — Z1211 Encounter for screening for malignant neoplasm of colon: Secondary | ICD-10-CM

## 2017-09-13 NOTE — Patient Instructions (Signed)

## 2017-09-13 NOTE — Progress Notes (Signed)
47 y.o. 742P1011 Divorced Caucasian female here for annual exam.    Menses are more like every other month.  Some mini hot flashes.  Not overwhelming.   PCP:   None per patient   Patient's last menstrual period was 08/04/2017.     Period Cycle (Days): 40 Period Duration (Days): 4 Period Pattern: (!) Irregular Menstrual Flow: Light Dysmenorrhea: (!) Mild Dysmenorrhea Symptoms: Other (Comment)(back pain )     Sexually active: No.  The current method of family planning is abstinence.    Exercising: Yes.    yoga, HIIT Smoker:  no  Health Maintenance: Pap:  09-11-16 negative, HR HPV negative            08-10-13 negative, HR HPV negative  History of abnormal Pap:  no MMG:  11-30-16 density c/BIRADS 1 negative  Colonoscopy:  n/a BMD:   n/a  Result  n/a TDaP:  06-09-11  Gardasil:   no HIV: unsure  Hep C: screened at work a few years ago Screening Labs:  Hb today: discuss with provider, Urine today: not collected   reports that she has never smoked. She has never used smokeless tobacco. She reports that she drinks about 1.2 - 1.8 oz of alcohol per week. She reports that she does not use drugs.  Past Medical History:  Diagnosis Date  . Allergic rhinitis, cause unspecified   . Migraines     Past Surgical History:  Procedure Laterality Date  . LIPOMA EXCISION  03/2010   right flank    Current Outpatient Medications  Medication Sig Dispense Refill  . BEPREVE 1.5 % SOLN   4  . Calcium Carbonate Antacid (TUMS PO) Take by mouth daily.    . Calcium Citrate-Vitamin D (CALCIUM + D PO) Take by mouth.    . cetirizine (ZYRTEC) 10 MG tablet Take 10 mg by mouth daily.    . fluticasone (FLONASE) 50 MCG/ACT nasal spray Place 2 sprays into the nose as needed for rhinitis. 16 g 6  . Ibuprofen (ADVIL PO) Take by mouth as needed.    . Multiple Vitamins-Minerals (MULTIVITAMIN PO) Take by mouth daily.     No current facility-administered medications for this visit.     Family History  Problem  Relation Age of Onset  . Arthritis Mother   . Breast cancer Mother [id number]  . Prostate cancer Maternal Grandfather   . Arthritis Maternal Grandfather   . Arthritis Maternal Grandmother   . Stroke Other     Review of Systems  Constitutional: Negative.   HENT: Negative.   Eyes: Negative.   Respiratory: Negative.   Cardiovascular: Negative.   Gastrointestinal: Negative.   Endocrine: Negative.   Genitourinary: Negative.   Musculoskeletal: Negative.   Skin: Negative.   Allergic/Immunologic: Negative.   Neurological: Negative.   Hematological: Negative.   Psychiatric/Behavioral: Negative.     Exam:   BP (!) 146/82 (BP Location: Right Arm, Patient Position: Sitting, Cuff Size: Normal)   Pulse 86   Resp 14   Ht 5\' 1"  (1.549 m)   Wt 122 lb (55.3 kg)   LMP 08/04/2017   BMI 23.05 kg/m     General appearance: alert, cooperative and appears stated age Head: Normocephalic, without obvious abnormality, atraumatic Neck: no adenopathy, supple, symmetrical, trachea midline and thyroid normal to inspection and palpation Lungs: clear to auscultation bilaterally Breasts: normal appearance, no masses or tenderness, No nipple retraction or dimpling, No nipple discharge or bleeding, No axillary or supraclavicular adenopathy Heart: regular rate and rhythm Abdomen:  soft, non-tender; no masses, no organomegaly Extremities: extremities normal, atraumatic, no cyanosis or edema Skin: Skin color, texture, turgor normal. No rashes or lesions Lymph nodes: Cervical, supraclavicular, and axillary nodes normal. No abnormal inguinal nodes palpated Neurologic: Grossly normal  Pelvic: External genitalia:  no lesions              Urethra:  normal appearing urethra with no masses, tenderness or lesions              Bartholins and Skenes: normal                 Vagina: normal appearing vagina with normal color and discharge, no lesions              Cervix: no lesions              Pap taken: No. Bimanual Exam:   Uterus:  normal size, contour, position, consistency, mobility, non-tender              Adnexa: no mass, fullness, tenderness              Rectal exam: Yes.  .  Confirms.              Anus:  normal sphincter tone, no lesions  Chaperone was present for exam.  Assessment:   Well woman visit with normal exam. Borderline BP elevation. FH breast cancer.  Hx low vit D.  Borderline. Mother and maternal grandmother with breast cancer.  Mother tested negative twice for genetic mutation.  Family history of prostate cancer.  Plan: Mammogram screening. Recommended self breast awareness. Pap and HR HPV as above. Guidelines for Calcium, Vitamin D, regular exercise program including cardiovascular and weight bearing exercise. Routine labs.  IFOB. Follow up annually and prn.   After visit summary provided.

## 2017-09-14 LAB — COMPREHENSIVE METABOLIC PANEL
ALT: 18 IU/L (ref 0–32)
AST: 19 IU/L (ref 0–40)
Albumin/Globulin Ratio: 2 (ref 1.2–2.2)
Albumin: 4.9 g/dL (ref 3.5–5.5)
Alkaline Phosphatase: 54 IU/L (ref 39–117)
BUN/Creatinine Ratio: 15 (ref 9–23)
BUN: 11 mg/dL (ref 6–24)
Bilirubin Total: 0.6 mg/dL (ref 0.0–1.2)
CALCIUM: 10 mg/dL (ref 8.7–10.2)
CHLORIDE: 100 mmol/L (ref 96–106)
CO2: 24 mmol/L (ref 20–29)
CREATININE: 0.71 mg/dL (ref 0.57–1.00)
GFR, EST AFRICAN AMERICAN: 118 mL/min/{1.73_m2} (ref >59)
GFR, EST NON AFRICAN AMERICAN: 102 mL/min/{1.73_m2} (ref >59)
GLOBULIN, TOTAL: 2.4 g/dL (ref 1.5–4.5)
GLUCOSE: 86 mg/dL (ref 65–99)
POTASSIUM: 4.4 mmol/L (ref 3.5–5.2)
Sodium: 141 mmol/L (ref 134–144)
Total Protein: 7.3 g/dL (ref 6.0–8.5)

## 2017-09-14 LAB — VITAMIN D 25 HYDROXY (VIT D DEFICIENCY, FRACTURES): Vit D, 25-Hydroxy: 33.4 ng/mL (ref 30.0–100.0)

## 2017-09-14 LAB — CBC
HEMOGLOBIN: 13.4 g/dL (ref 11.1–15.9)
Hematocrit: 41.8 % (ref 34.0–46.6)
MCH: 28.6 pg (ref 26.6–33.0)
MCHC: 32.1 g/dL (ref 31.5–35.7)
MCV: 89 fL (ref 79–97)
Platelets: 275 10*3/uL (ref 150–450)
RBC: 4.69 x10E6/uL (ref 3.77–5.28)
RDW: 13.1 % (ref 12.3–15.4)
WBC: 5.3 10*3/uL (ref 3.4–10.8)

## 2017-09-14 LAB — LIPID PANEL
CHOL/HDL RATIO: 2.7 ratio (ref 0.0–4.4)
Cholesterol, Total: 216 mg/dL — ABNORMAL HIGH (ref 100–199)
HDL: 81 mg/dL (ref >39)
LDL CALC: 117 mg/dL — AB (ref 0–99)
Triglycerides: 90 mg/dL (ref 0–149)
VLDL Cholesterol Cal: 18 mg/dL (ref 5–40)

## 2017-09-14 LAB — TSH: TSH: 1.74 u[IU]/mL (ref 0.450–4.500)

## 2017-09-22 DIAGNOSIS — Z1211 Encounter for screening for malignant neoplasm of colon: Secondary | ICD-10-CM | POA: Diagnosis not present

## 2017-09-25 LAB — FECAL OCCULT BLOOD, IMMUNOCHEMICAL: FECAL OCCULT BLD: NEGATIVE

## 2017-10-21 ENCOUNTER — Telehealth: Payer: Self-pay | Admitting: Obstetrics and Gynecology

## 2017-10-21 NOTE — Telephone Encounter (Signed)
Left message for patient to reschedule appointment.

## 2017-11-18 ENCOUNTER — Other Ambulatory Visit: Payer: Self-pay | Admitting: Obstetrics and Gynecology

## 2017-11-18 DIAGNOSIS — Z1231 Encounter for screening mammogram for malignant neoplasm of breast: Secondary | ICD-10-CM

## 2017-11-22 ENCOUNTER — Other Ambulatory Visit: Payer: Self-pay | Admitting: Obstetrics and Gynecology

## 2017-11-22 DIAGNOSIS — Z1231 Encounter for screening mammogram for malignant neoplasm of breast: Secondary | ICD-10-CM

## 2017-12-01 DIAGNOSIS — H5213 Myopia, bilateral: Secondary | ICD-10-CM | POA: Diagnosis not present

## 2017-12-02 ENCOUNTER — Ambulatory Visit
Admission: RE | Admit: 2017-12-02 | Discharge: 2017-12-02 | Disposition: A | Discharge status: Home / Self Care | Payer: 59 | Source: Ambulatory Visit

## 2017-12-02 DIAGNOSIS — Z1231 Encounter for screening mammogram for malignant neoplasm of breast: Secondary | ICD-10-CM

## 2017-12-02 IMAGING — MG DIGITAL SCREENING BILATERAL MAMMOGRAM WITH TOMO AND CAD
8 series · 9 of 24 positions shown · non-contrast
Comparison: Previous exam(s).

CLINICAL DATA: Screening.

EXAM:
DIGITAL SCREENING BILATERAL MAMMOGRAM WITH TOMO AND CAD

[L MLO synth-2D]
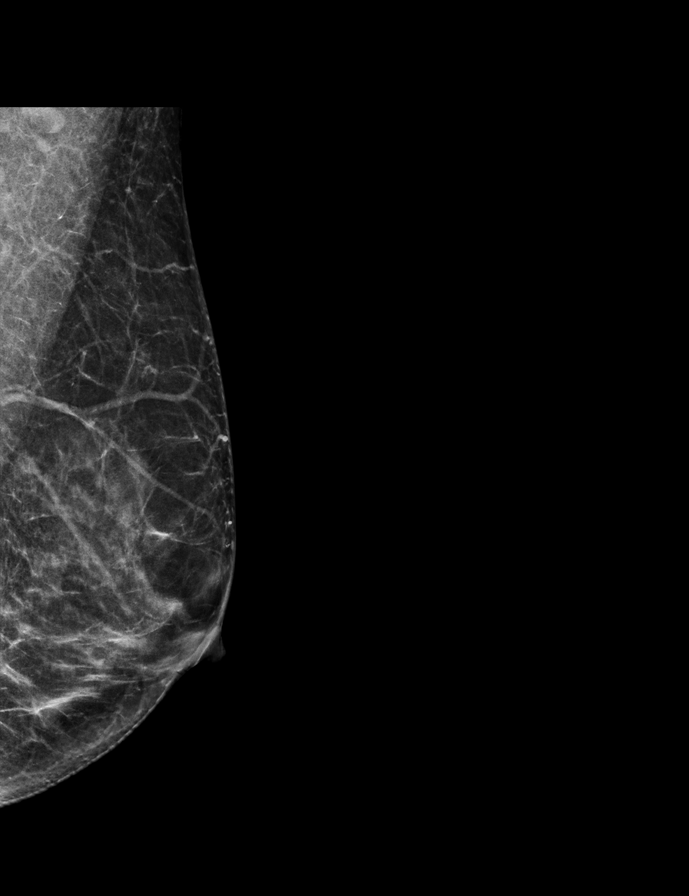

[L CC synth-2D]
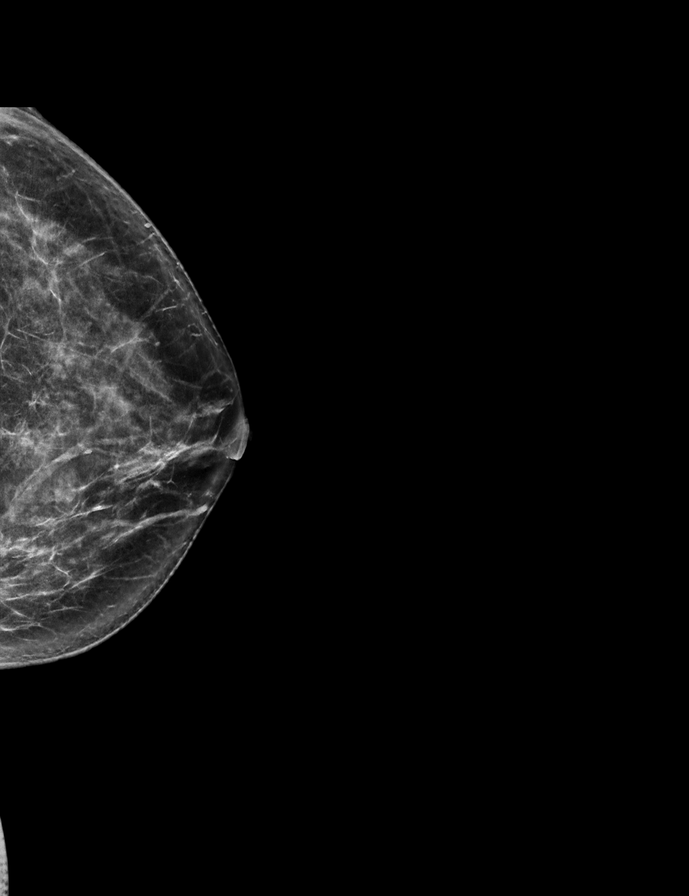

[R CC synth-2D]
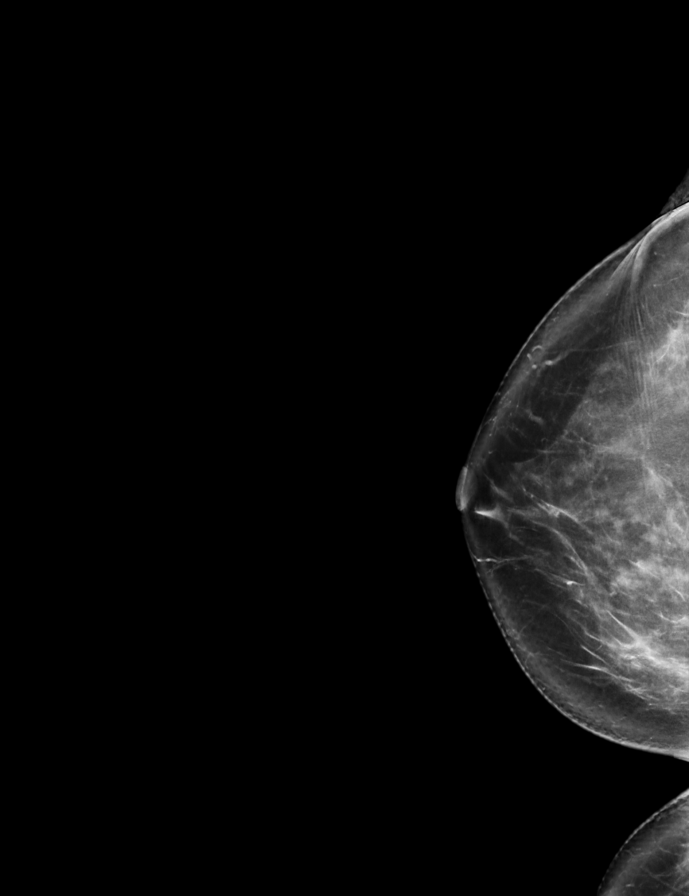

[R MLO synth-2D]
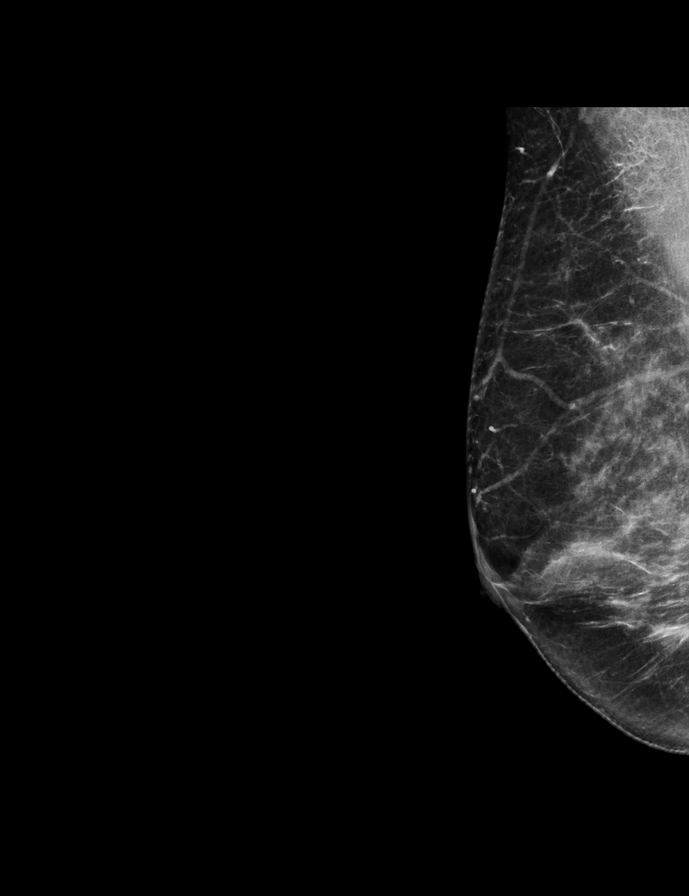

[L CC tomo · 2 of 63 frames shown]
[frame 21/63]
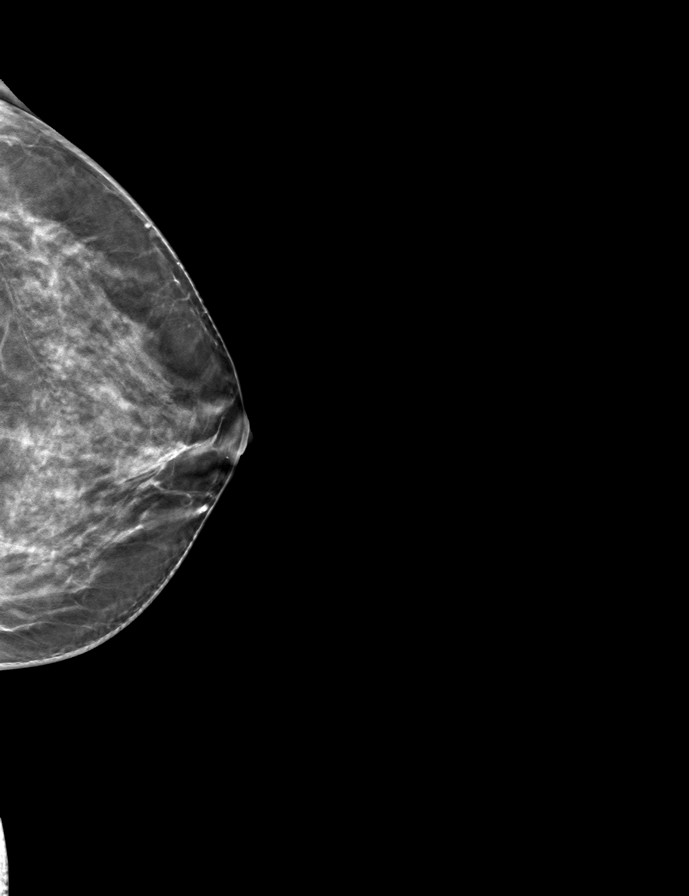
[frame 32/63]
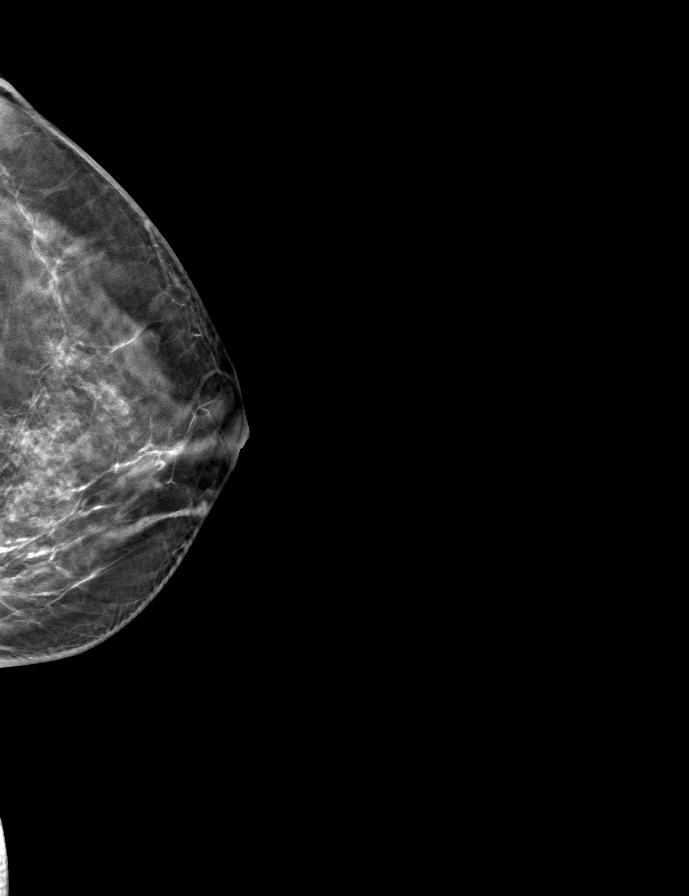

[R CC tomo · tomo slice 39/77.0]
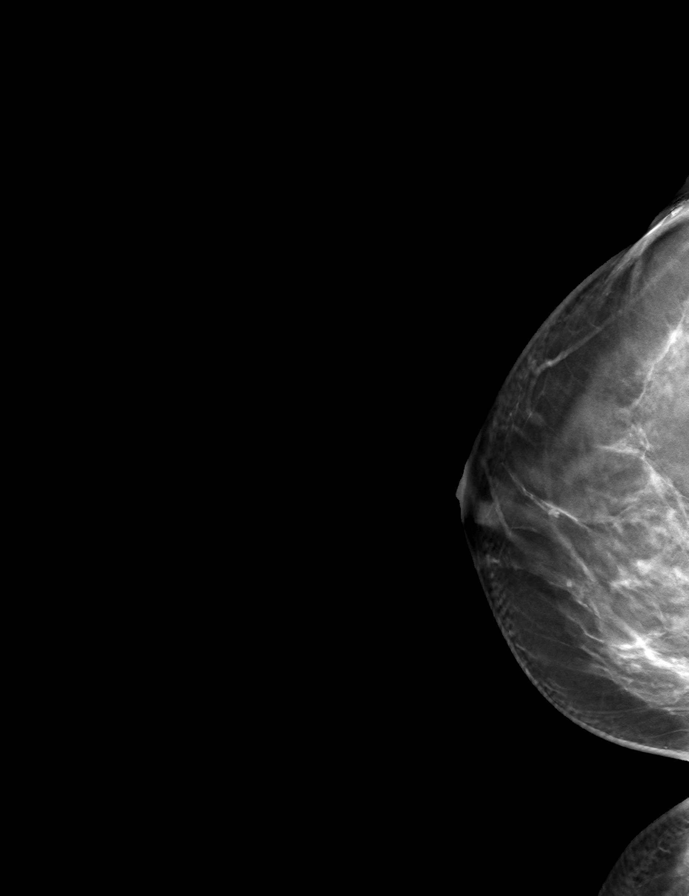

[R MLO tomo · tomo slice 33/65.0]
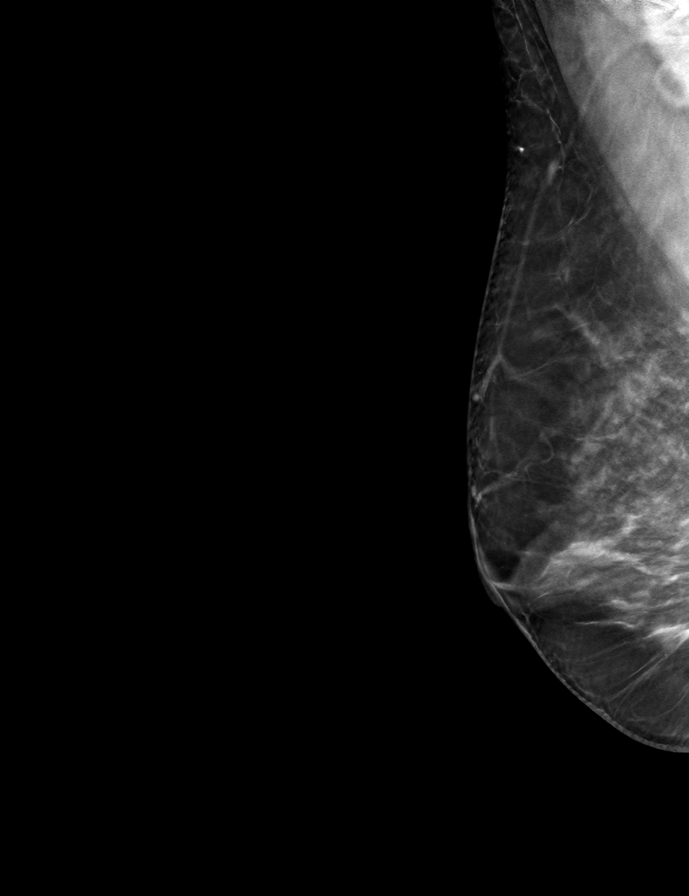

[L MLO tomo · tomo slice 29/56.0]
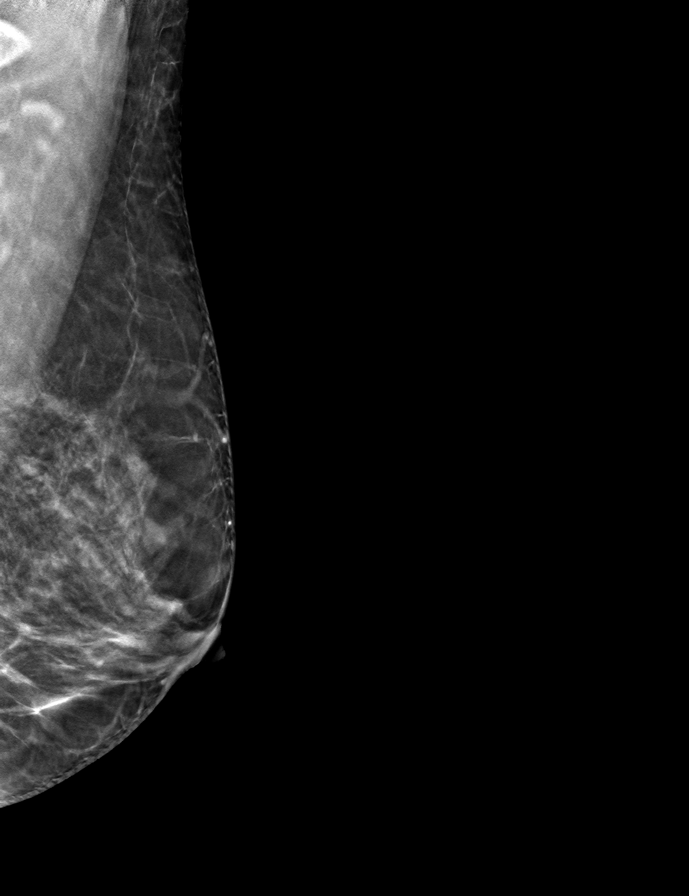

[9 of 24 positions shown; findings below may reference images not displayed]

ACR Breast Density Category c: The breast tissue is heterogeneously
dense, which may obscure small masses.
FINDINGS: There are no findings suspicious for malignancy. Images were
processed with CAD.
IMPRESSION: No mammographic evidence of malignancy. A result letter of this
screening mammogram will be mailed directly to the patient.

RECOMMENDATION:
Screening mammogram in one year. (Code:[5V])

BI-RADS CATEGORY  1: Negative.

## 2017-12-03 MED FILL — BEPREVE 1.5% EYE DROPS: 1.5 | 90 days supply | Qty: 10 | Fill #0

## 2017-12-14 MED FILL — BEPREVE 1.5% EYE DROPS: 1.5 | 50 days supply | Qty: 10 | Fill #0

## 2018-06-03 MED FILL — BEPREVE 1.5% EYE DROPS: 1.5 | 50 days supply | Qty: 10 | Fill #1

## 2018-09-15 ENCOUNTER — Telehealth: Payer: Self-pay | Admitting: Obstetrics and Gynecology

## 2018-09-15 ENCOUNTER — Encounter: Payer: Self-pay | Admitting: Obstetrics and Gynecology

## 2018-09-15 NOTE — Telephone Encounter (Signed)
Spoke with patient. Advised patient to write down all her questions/concerns about peri-menopause/birth control and bring to her AEX appt.10-12-18 with Dr.Silva and she will be happy to address at that time. I did explain we could not do IUD insertion at that time if IUD is what she decided upon. Patient voiced understanding and thanked me for calling her back.

## 2018-09-15 NOTE — Telephone Encounter (Signed)
Left message for patient to call Epimenio Schetter, CMA. 

## 2018-09-15 NOTE — Telephone Encounter (Signed)
Patient sent the following correspondence through [place]. Routing to triage to assist patient with request.  Hi Dr. Quincy Simmonds. I hope you are well. I have my annual visit coming up and wanted to go ahead and ask your thoughts on birth control. I know I am peri-menopausal so not sure what I should do (?). Most of my friends in their 38s seem to have an IUD. If this is the route you think I should go could it all be done at my annual visit? Thank you!!

## 2018-10-06 ENCOUNTER — Ambulatory Visit: Payer: 59 | Admitting: Obstetrics and Gynecology

## 2018-10-12 ENCOUNTER — Ambulatory Visit (INDEPENDENT_AMBULATORY_CARE_PROVIDER_SITE_OTHER): Payer: 59 | Admitting: Obstetrics and Gynecology

## 2018-10-12 ENCOUNTER — Encounter: Payer: Self-pay | Admitting: Obstetrics and Gynecology

## 2018-10-12 ENCOUNTER — Other Ambulatory Visit (HOSPITAL_COMMUNITY)
Admission: RE | Admit: 2018-10-12 | Discharge: 2018-10-12 | Disposition: A | Discharge status: Home / Self Care | Payer: 59 | Source: Ambulatory Visit | Attending: Obstetrics and Gynecology | Admitting: Obstetrics and Gynecology

## 2018-10-12 ENCOUNTER — Other Ambulatory Visit: Payer: Self-pay

## 2018-10-12 VITALS — BP 134/84 | HR 82 | Temp 97.4°F | Ht 61.25 in | Wt 111.4 lb

## 2018-10-12 DIAGNOSIS — Z113 Encounter for screening for infections with a predominantly sexual mode of transmission: Secondary | ICD-10-CM

## 2018-10-12 DIAGNOSIS — N926 Irregular menstruation, unspecified: Secondary | ICD-10-CM | POA: Diagnosis not present

## 2018-10-12 DIAGNOSIS — Z01419 Encounter for gynecological examination (general) (routine) without abnormal findings: Secondary | ICD-10-CM

## 2018-10-12 DIAGNOSIS — Z1211 Encounter for screening for malignant neoplasm of colon: Secondary | ICD-10-CM

## 2018-10-12 LAB — POCT URINE PREGNANCY: Preg Test, Ur: NEGATIVE

## 2018-10-12 MED ORDER — LO LOESTRIN FE 1 MG-10 MCG / 10 MCG PO TABS: 1.0000 | ORAL_TABLET | Freq: Every day | ORAL | 0 refills | Status: DC

## 2018-10-12 NOTE — Patient Instructions (Signed)

## 2018-10-12 NOTE — Progress Notes (Signed)
48 y.o. G45P1011 Divorced Caucasian female here for annual exam.    Lost weight intentionally.  Working out.   Menses every 3 - 4 months.  Some increased heat.  Tolerable.   Needs pregnancy prevention.  Denies migraine with aura, not a smoker, no HTN, liver or breast disease, thromboembolic event for self or other.   BP 110/70 at home.   PCP:   None   LMP: 07/22/18           Sexually active: Yes.    The current method of family planning is none.   Condoms some of the time.  Last intercourse one month ago.  Exercising: Yes.    cardio, weights, yoga.  Smoker:  no  Health Maintenance: Pap:  09/11/16 Negative, Hr Hpv Neg  08/10/13 Negative Hr Hpv Neg  History of abnormal Pap:  no MMG:  12/02/17 Birads 1 Neg density C Colonoscopy:  never BMD:   No  Result  na TDaP:  06/09/11 Gardasil:   no HIV:  today Hep C:  today Screening Labs:  Hb today: yes, Urine today: no   reports that she has never smoked. She has never used smokeless tobacco. She reports current alcohol use of about 2.0 - 3.0 standard drinks of alcohol per week. She reports that she does not use drugs.  Past Medical History:  Diagnosis Date  . Allergic rhinitis, cause unspecified   . Migraines    without aura    Past Surgical History:  Procedure Laterality Date  . LIPOMA EXCISION  03/2010   right flank    Current Outpatient Medications  Medication Sig Dispense Refill  . BEPREVE 1.5 % SOLN   4  . Calcium Carbonate Antacid (TUMS PO) Take by mouth daily.    . Calcium Citrate-Vitamin D (CALCIUM + D PO) Take by mouth.    . cetirizine (ZYRTEC) 10 MG tablet Take 10 mg by mouth daily.    . fluticasone (FLONASE) 50 MCG/ACT nasal spray Place 2 sprays into the nose as needed for rhinitis. 16 g 6  . Ibuprofen (ADVIL PO) Take by mouth as needed.    . Multiple Vitamins-Minerals (MULTIVITAMIN PO) Take by mouth daily.    . Norethindrone-Ethinyl Estradiol-Fe Biphas (LO LOESTRIN FE) 1 MG-10 MCG / 10 MCG tablet Take 1 tablet by  mouth daily. 3 Package 0   No current facility-administered medications for this visit.     Family History  Problem Relation Age of Onset  . Arthritis Mother   . Breast cancer Mother 35  . Prostate cancer Maternal Grandfather   . Arthritis Maternal Grandfather   . Arthritis Maternal Grandmother   . Stroke Other     Review of Systems  All other systems reviewed and are negative.   Exam:   BP 134/84   Pulse 82   Temp (!) 97.4 F (36.3 C)   Ht 5' 1.25" (1.556 m)   Wt 111 lb 6.4 oz (50.5 kg)   LMP 07/22/2018 (Exact Date)   SpO2 97%   BMI 20.88 kg/m     General appearance: alert, cooperative and appears stated age Head: normocephalic, without obvious abnormality, atraumatic Neck: no adenopathy, supple, symmetrical, trachea midline and thyroid normal to inspection and palpation Lungs: clear to auscultation bilaterally Breasts: normal appearance, no masses or tenderness, No nipple retraction or dimpling, No nipple discharge or bleeding, No axillary adenopathy Heart: regular rate and rhythm Abdomen: soft, non-tender; no masses, no organomegaly Extremities: extremities normal, atraumatic, no cyanosis or edema Skin: skin  color, texture, turgor normal. No rashes or lesions Lymph nodes: cervical, supraclavicular, and axillary nodes normal. Neurologic: grossly normal  Pelvic: External genitalia:  no lesions              No abnormal inguinal nodes palpated.              Urethra:  normal appearing urethra with no masses, tenderness or lesions              Bartholins and Skenes: normal                 Vagina: normal appearing vagina with normal color and discharge, no lesions              Cervix: no lesions              Pap taken: No. Bimanual Exam:  Uterus:  normal size, contour, position, consistency, mobility, non-tender              Adnexa: no mass, fullness, tenderness              Rectal exam: Yes.  .  Confirms.              Anus:  normal sphincter tone, no  lesions  Chaperone was present for exam.  Assessment:   Well woman visit with normal exam. FH breast cancer.  Hx low vit D.  Borderline. Mother and maternal grandmother with breast cancer.  Mother tested negative twice for genetic mutation.  Family history of prostate cancer. STD screening.   Plan: Mammogram screening discussed. Self breast awareness reviewed. Pap and HR HPV as above. Guidelines for Calcium, Vitamin D, regular exercise program including cardiovascular and weight bearing exercise. UPT - if negative, start COCs.  I discussed risk of stroke, DVT, PE and MI. Routine labs and STD screening.  IFOB. Return in 3 months.  Follow up annually and prn.   After visit summary provided.

## 2018-10-13 LAB — CBC
Hematocrit: 43.6 % (ref 34.0–46.6)
Hemoglobin: 14.7 g/dL (ref 11.1–15.9)
MCH: 29.6 pg (ref 26.6–33.0)
MCHC: 33.7 g/dL (ref 31.5–35.7)
MCV: 88 fL (ref 79–97)
Platelets: 292 10*3/uL (ref 150–450)
RBC: 4.97 x10E6/uL (ref 3.77–5.28)
RDW: 12.1 % (ref 11.7–15.4)
WBC: 7 10*3/uL (ref 3.4–10.8)

## 2018-10-13 LAB — COMPREHENSIVE METABOLIC PANEL
ALT: 16 IU/L (ref 0–32)
AST: 22 IU/L (ref 0–40)
Albumin/Globulin Ratio: 2.1 (ref 1.2–2.2)
Albumin: 4.9 g/dL — ABNORMAL HIGH (ref 3.8–4.8)
Alkaline Phosphatase: 57 IU/L (ref 39–117)
BUN/Creatinine Ratio: 21 (ref 9–23)
BUN: 15 mg/dL (ref 6–24)
Bilirubin Total: 0.5 mg/dL (ref 0.0–1.2)
CO2: 27 mmol/L (ref 20–29)
Calcium: 9.6 mg/dL (ref 8.7–10.2)
Chloride: 99 mmol/L (ref 96–106)
Creatinine, Ser: 0.7 mg/dL (ref 0.57–1.00)
GFR calc Af Amer: 119 mL/min/{1.73_m2} (ref >59)
GFR calc non Af Amer: 104 mL/min/{1.73_m2} (ref >59)
Globulin, Total: 2.3 g/dL (ref 1.5–4.5)
Glucose: 91 mg/dL (ref 65–99)
Potassium: 3.9 mmol/L (ref 3.5–5.2)
Sodium: 141 mmol/L (ref 134–144)
Total Protein: 7.2 g/dL (ref 6.0–8.5)

## 2018-10-13 LAB — LIPID PANEL
Chol/HDL Ratio: 2.8 ratio (ref 0.0–4.4)
Cholesterol, Total: 233 mg/dL — ABNORMAL HIGH (ref 100–199)
HDL: 84 mg/dL (ref >39)
LDL Calculated: 132 mg/dL — ABNORMAL HIGH (ref 0–99)
Triglycerides: 87 mg/dL (ref 0–149)
VLDL Cholesterol Cal: 17 mg/dL (ref 5–40)

## 2018-10-13 LAB — HEPATITIS C ANTIBODY: Hep C Virus Ab: <0.1 s/co ratio (ref 0.0–0.9)

## 2018-10-13 LAB — TSH: TSH: 1.28 u[IU]/mL (ref 0.450–4.500)

## 2018-10-13 LAB — VITAMIN D 25 HYDROXY (VIT D DEFICIENCY, FRACTURES): Vit D, 25-Hydroxy: 31.3 ng/mL (ref 30.0–100.0)

## 2018-10-13 LAB — HEP, RPR, HIV PANEL
HIV Screen 4th Generation wRfx: NONREACTIVE
Hepatitis B Surface Ag: NEGATIVE
RPR Ser Ql: NONREACTIVE

## 2018-10-13 MED FILL — LO LOESTRIN FE 1-10 TABLET: 1 MG-10 MCG | 84 days supply | Qty: 84 | Fill #0

## 2018-10-14 ENCOUNTER — Encounter: Payer: Self-pay | Admitting: Obstetrics and Gynecology

## 2018-10-14 LAB — CERVICOVAGINAL ANCILLARY ONLY
Chlamydia: NEGATIVE
Neisseria Gonorrhea: NEGATIVE
Trichomonas: NEGATIVE

## 2018-10-15 MED FILL — BEPREVE 1.5% EYE DROPS: 1.5 | 50 days supply | Qty: 10 | Fill #2

## 2018-11-11 ENCOUNTER — Other Ambulatory Visit: Payer: Self-pay | Admitting: Obstetrics and Gynecology

## 2018-11-11 DIAGNOSIS — Z1231 Encounter for screening mammogram for malignant neoplasm of breast: Secondary | ICD-10-CM

## 2018-12-10 ENCOUNTER — Encounter: Payer: Self-pay | Admitting: Obstetrics and Gynecology

## 2018-12-10 ENCOUNTER — Other Ambulatory Visit: Payer: Self-pay | Admitting: Obstetrics and Gynecology

## 2018-12-11 NOTE — Telephone Encounter (Signed)
Forwarding to Dr. Silva 

## 2018-12-12 ENCOUNTER — Telehealth: Payer: Self-pay | Admitting: Obstetrics and Gynecology

## 2018-12-12 MED ORDER — LO LOESTRIN FE 1 MG-10 MCG / 10 MCG PO TABS: 1.0000 | ORAL_TABLET | Freq: Every day | ORAL | 2 refills | Status: DC

## 2018-12-12 NOTE — Telephone Encounter (Signed)
Please reach out to patient to see how she is doing on COCs.   I would like for her to check her blood pressure and report this to me.   After I hear back, I can refill her pills until her annual exam is due again.

## 2018-12-12 NOTE — Telephone Encounter (Signed)
Patient updated prescription sent to [place].

## 2018-12-12 NOTE — Telephone Encounter (Signed)
Patient returning call to triage. Triage was unavailable at time of call.

## 2018-12-12 NOTE — Telephone Encounter (Signed)
Returned call to patient. Patient states that her last period was about a month ago and it was very light. Patient states hot flashes have resolved on Lo Loestrin. States the pharmacy gave her a coupon to make medication more affordable. Patient interested in staying on name brand. Declines OV for 3 month follow up. States current prescription has been working really well for her, no concerns to discuss. Patient states she just started her last pack of pills. Requesting refill to [facility].    Medication pended for Lo Loestrin through refill request.   Routing to provider for review and to advise on prescription.

## 2018-12-12 NOTE — Telephone Encounter (Signed)
Message left to return call to Triage Nurse at [phone].    

## 2018-12-12 NOTE — Telephone Encounter (Signed)
[facility] for refill of LoLoestrin 3 packs and 2 refills.

## 2018-12-12 NOTE — Telephone Encounter (Signed)
Non-Urgent Medical Question Received: 2 days ago Message Contents  Alyssa Livingston, Alyssa Livingston sent to [facility]  Phone Number: [phone]        Hi. I believe Dr Quincy Simmonds wanted me to f/u about the pills. The lo loestrin fe seems to be working well. My last BP was 120/76 at home. Resting HR is still in 60s.  I havent had hot flashes since the first 2-3 wks on this med. I put in for the refill needed. Thank you.

## 2018-12-12 NOTE — Telephone Encounter (Signed)
Encounter closed. See telephone call dated 12-12-2018.

## 2018-12-12 NOTE — Telephone Encounter (Signed)
Prescription for LoLoestrin #3, 2RF sent to [facility].   Will close encounter.

## 2018-12-19 DIAGNOSIS — H5213 Myopia, bilateral: Secondary | ICD-10-CM | POA: Diagnosis not present

## 2018-12-22 MED FILL — BEPREVE 1.5% EYE DROPS: 1.5 | 50 days supply | Qty: 5 | Fill #0

## 2019-01-02 ENCOUNTER — Other Ambulatory Visit: Payer: Self-pay

## 2019-01-02 ENCOUNTER — Ambulatory Visit
Admission: RE | Admit: 2019-01-02 | Discharge: 2019-01-02 | Disposition: A | Discharge status: Home / Self Care | Payer: 59 | Source: Ambulatory Visit | Attending: Obstetrics and Gynecology | Admitting: Obstetrics and Gynecology

## 2019-01-02 DIAGNOSIS — Z1231 Encounter for screening mammogram for malignant neoplasm of breast: Secondary | ICD-10-CM | POA: Diagnosis not present

## 2019-01-02 IMAGING — MG DIGITAL SCREENING BILAT W/ TOMO W/ CAD
8 series · 9 of 24 positions shown · non-contrast
Comparison: Previous exam(s).

CLINICAL DATA: Screening.

EXAM:
DIGITAL SCREENING BILATERAL MAMMOGRAM WITH TOMO AND CAD

[L CC synth-2D]
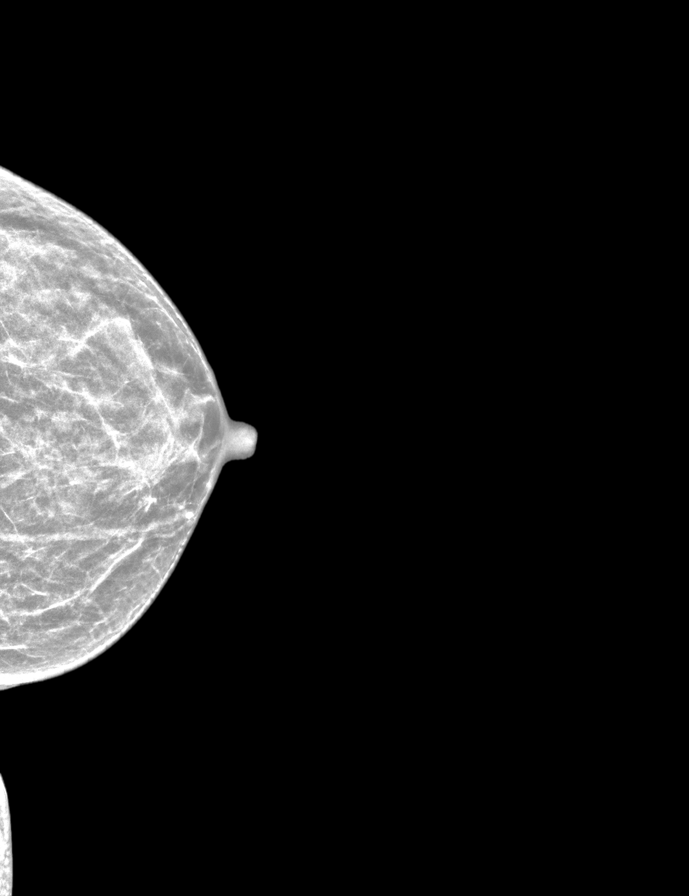

[R CC synth-2D]
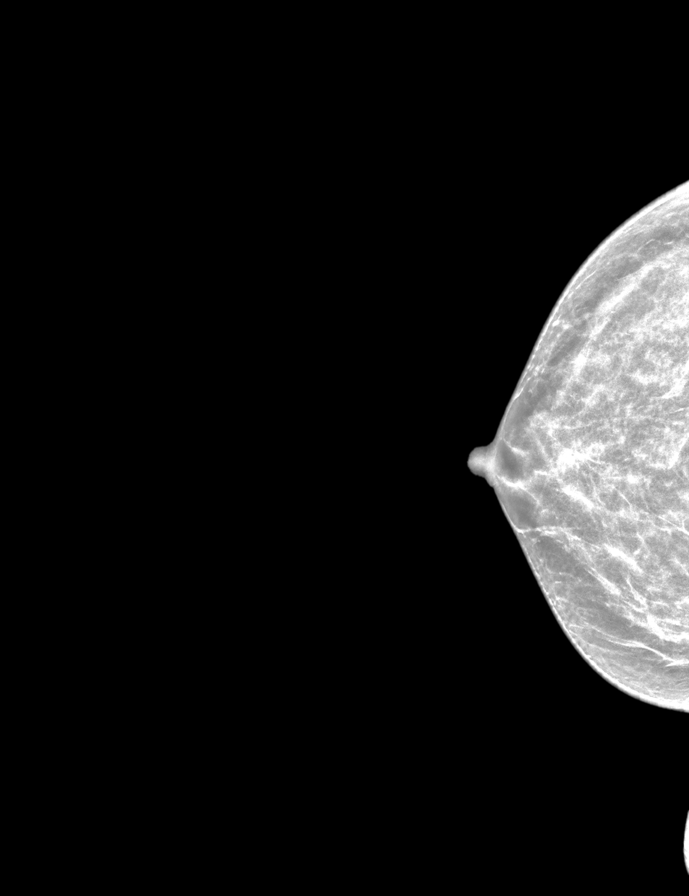

[L MLO synth-2D]
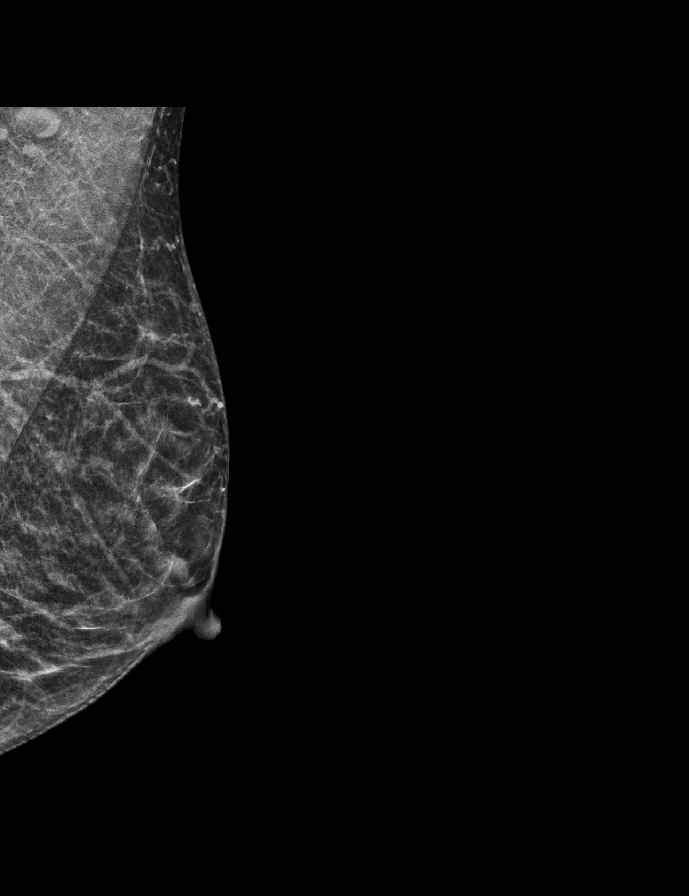

[R MLO synth-2D]
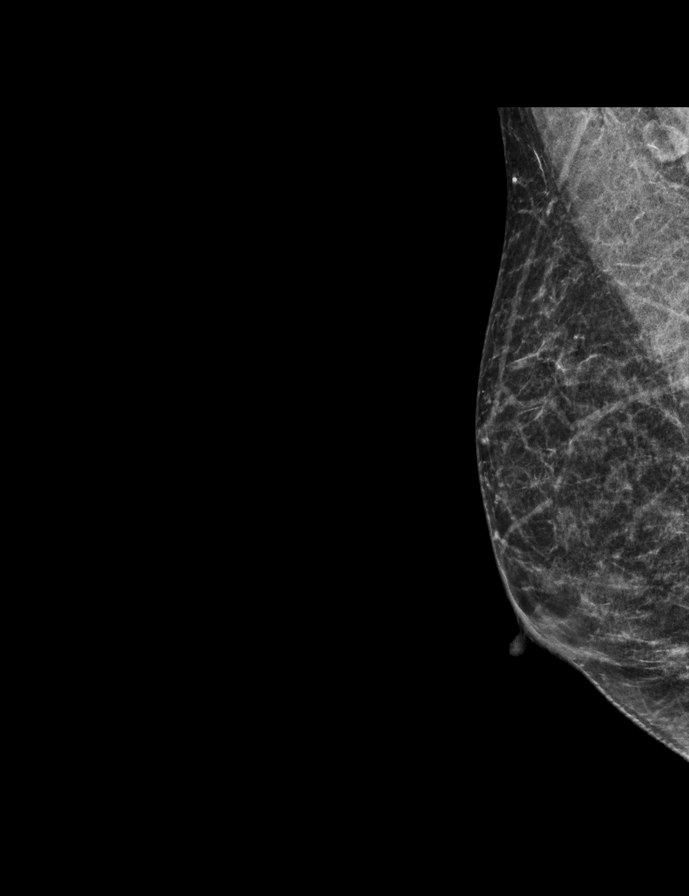

[L CC tomo · 2 of 40 frames shown]
[frame 13/40]
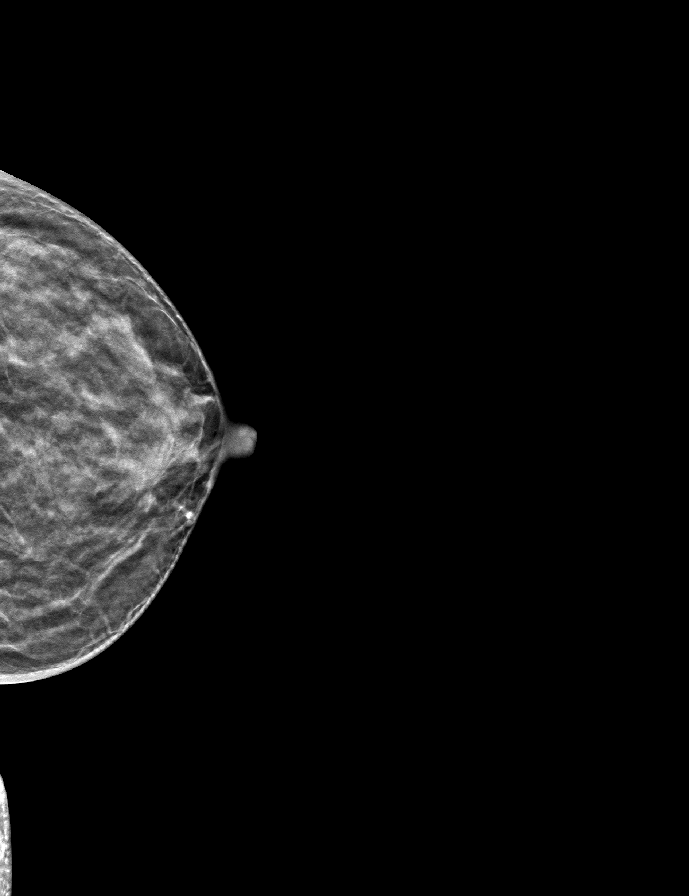
[frame 21/40]
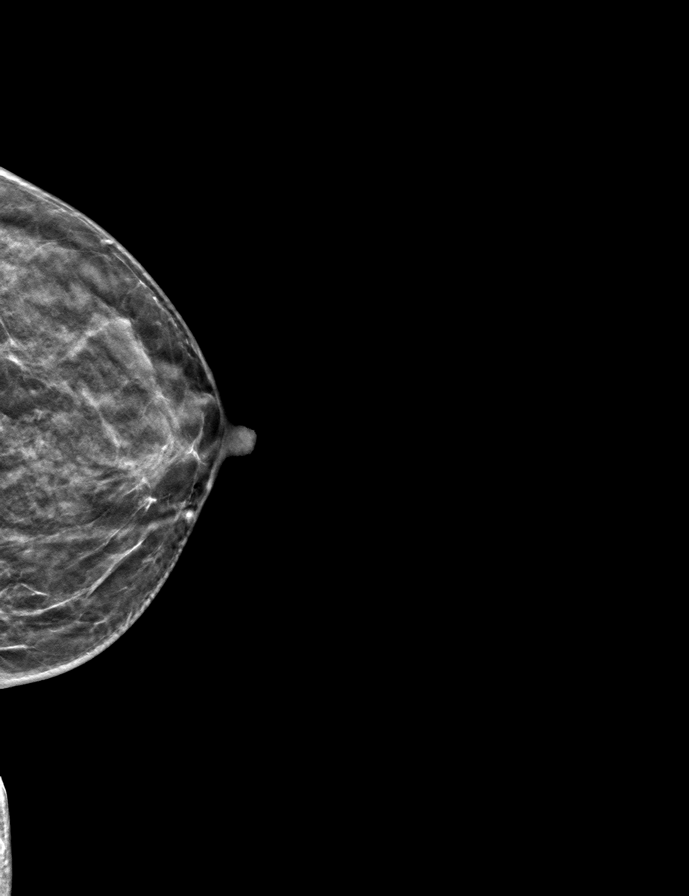

[R MLO tomo · tomo slice 24/47.0]
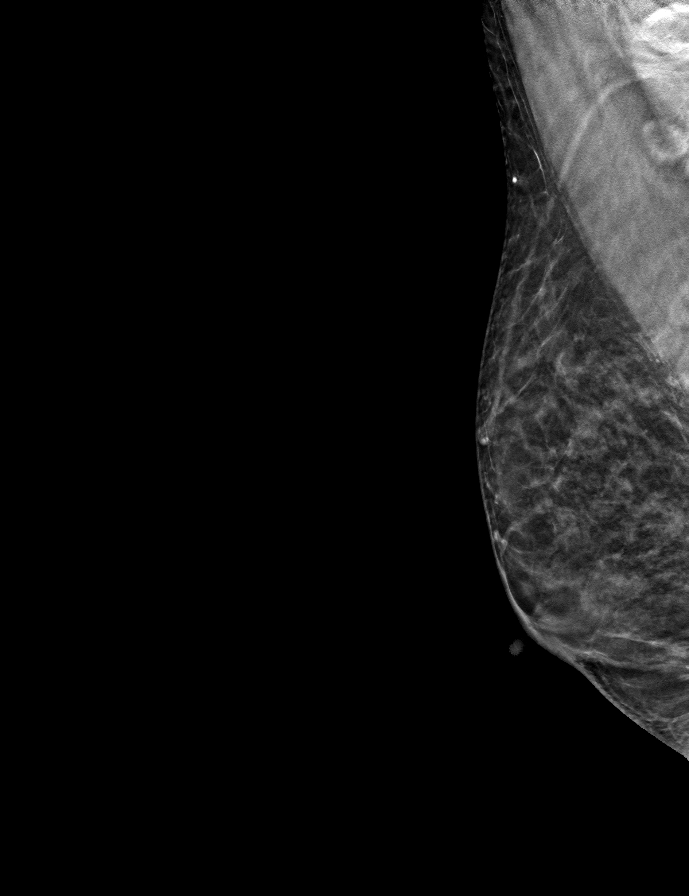

[R CC tomo · tomo slice 23/45.0]
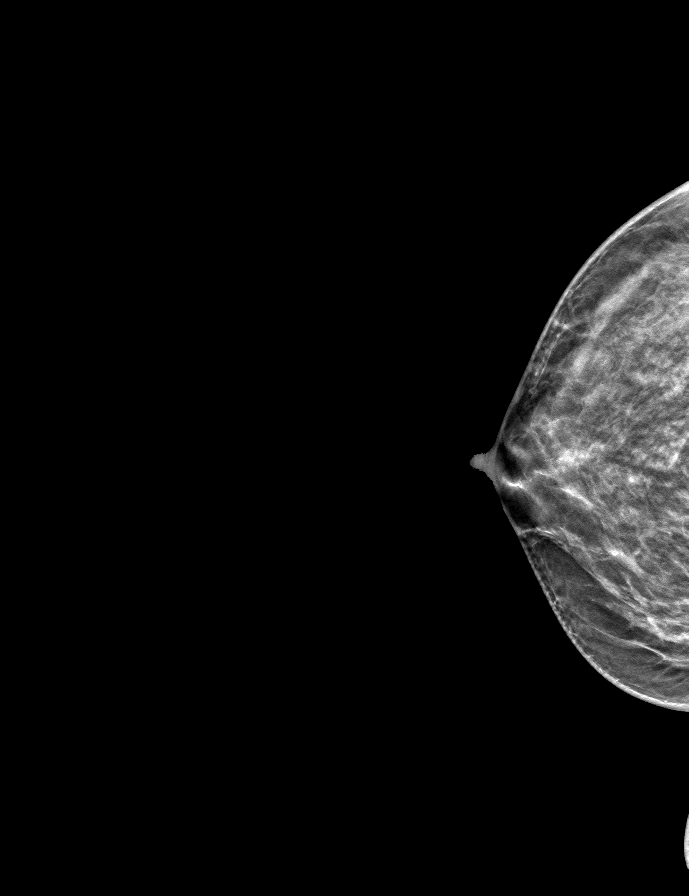

[L MLO tomo · tomo slice 22/43.0]
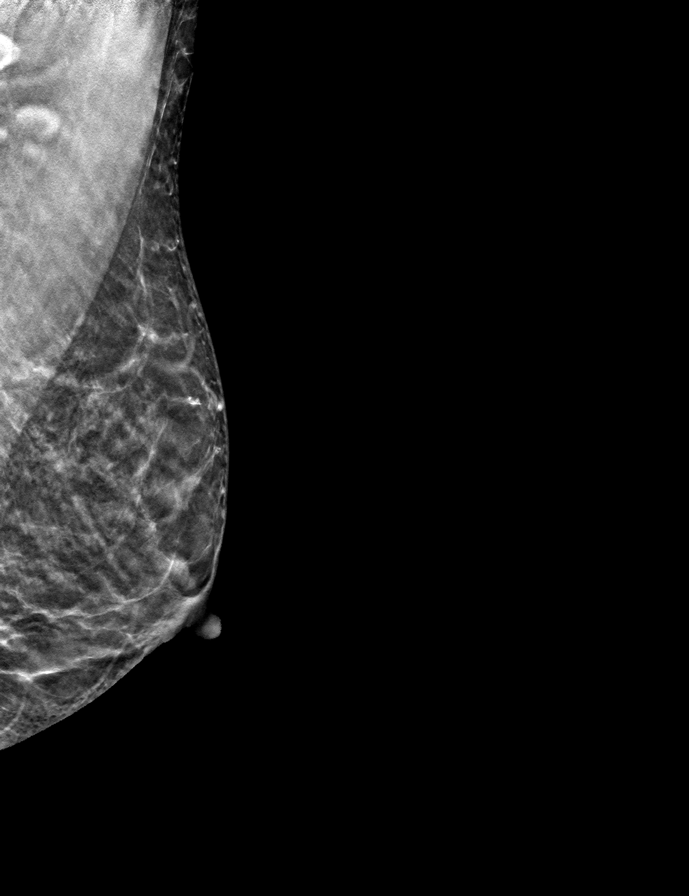

[9 of 24 positions shown; findings below may reference images not displayed]

ACR Breast Density Category c: The breast tissue is heterogeneously
dense, which may obscure small masses.
FINDINGS: There are no findings suspicious for malignancy. Images were
processed with CAD.
IMPRESSION: No mammographic evidence of malignancy. A result letter of this
screening mammogram will be mailed directly to the patient.

RECOMMENDATION:
Screening mammogram in one year. (Code:[5V])

BI-RADS CATEGORY  1: Negative.

## 2019-01-03 MED FILL — LO LOESTRIN FE 1-10 TABLET: 1 MG-10 MCG | 84 days supply | Qty: 84 | Fill #0

## 2019-02-06 MED FILL — BEPREVE 1.5% EYE DROPS: 1.5 | 50 days supply | Qty: 5 | Fill #1

## 2019-03-30 MED FILL — LO LOESTRIN FE 1-10 TABLET: 1 MG-10 MCG | 84 days supply | Qty: 84 | Fill #1

## 2019-04-27 MED FILL — BEPREVE 1.5% EYE DROPS: 1.5 | 50 days supply | Qty: 5 | Fill #2

## 2019-05-13 ENCOUNTER — Encounter: Payer: Self-pay | Admitting: Obstetrics and Gynecology

## 2019-05-15 ENCOUNTER — Telehealth: Payer: Self-pay | Admitting: Obstetrics and Gynecology

## 2019-05-15 NOTE — Telephone Encounter (Signed)
Call to patient, no answer, left detailed message, ok per dpr. Advised per Dr. Edward Jolly. Return call to office if any additional recommendations.   Encounter closed.

## 2019-05-15 NOTE — Telephone Encounter (Signed)
Have patient skip the white and brown pills at the end of the pack and go directly into a new pack.

## 2019-05-15 NOTE — Telephone Encounter (Signed)
Patient sent the following correspondence through MyChart.  Hi. I will be traveling at the end of my lo loestrin fe pill pack .Marland KitchenMarland KitchenI was wonder if you could tell me how can I take my pills/next pack to hopefully avoid a period? Thank you!

## 2019-05-15 NOTE — Telephone Encounter (Signed)
Routing to Dr. Silva to review and advise.  

## 2019-06-17 MED FILL — LO LOESTRIN FE 1-10 TABLET: 1 MG-10 MCG | 84 days supply | Qty: 84 | Fill #2

## 2019-09-11 ENCOUNTER — Other Ambulatory Visit: Payer: Self-pay | Admitting: Obstetrics and Gynecology

## 2019-09-11 MED ORDER — LO LOESTRIN FE 1 MG-10 MCG / 10 MCG PO TABS: 1.0000 | ORAL_TABLET | Freq: Every day | ORAL | 1 refills | Status: DC

## 2019-09-11 MED FILL — LO LOESTRIN FE 1-10 TABLET: 1 MG-10 MCG | 28 days supply | Qty: 28 | Fill #0

## 2019-09-11 NOTE — Telephone Encounter (Signed)
Medication refill request: Lo Loestrin FE Last AEX:  10/12/18 Dr. Edward Jolly Next AEX: 10/18/19 Last MMG (if hormonal medication request): 01/02/19 BIRADS 1 negative/density c Refill authorized: Today, please advise

## 2019-09-11 NOTE — Telephone Encounter (Signed)
Alyssa Livingston  [facility] Hi. I am replying to an old message as Alyssa Livingston will not let me start a new message. I do not have anymore refills on my lo lo estrin/runs out this week. I have an appointment with Dr Alyssa Livingston on August 4th. Could you possibly send a new script/refill to [facility] pharmacy? Thank you for your help.

## 2019-10-12 MED FILL — LO LOESTRIN FE 1-10 TABLET: 1 MG-10 MCG | 28 days supply | Qty: 28 | Fill #1

## 2019-10-17 NOTE — Progress Notes (Signed)
49 y.o. G83P1011 Divorced Caucasian female here for annual exam.    Every other month light cycle or none at all. Little bit of night sweats.   Takes her BP at home and it is 117/60s - 130/85.  Has received her Covid vaccine. Worked at [employer].  In a long distance relationship.  Declines STD screening.   PCP:  None--to make appt.  Patient's last menstrual period was 10/15/2019 (exact date).     Period Cycle (Days): 30 Period Duration (Days): 3-4 days Period Pattern: Regular Menstrual Flow: Light Menstrual Control: Panty liner Dysmenorrhea: (!) Mild (mild cramping prior to menses) Dysmenorrhea Symptoms: Cramping     Sexually active: Yes.    The current method of family planning is OCP (estrogen/progesterone)--LoLoestrin.    Exercising: Yes.    Gym/ health club routine includes cardio., weights, yoga Smoker:  no  Health Maintenance: Pap: 09-11-16 Neg:Neg HR HPV, 08-10-13 Neg:Neg HR HPV History of abnormal Pap:  no MMG:  01-02-19 3D/Neg/density C/BiRads1 Colonoscopy:  n/a BMD:   n/a  Result  n/a TDaP:  06-09-11 Gardasil:   no HIV:10-12-18 NR Hep C:10-12-18 Neg Screening Labs:  Today.    reports that she has never smoked. She has never used smokeless tobacco. She reports current alcohol use of about 2.0 standard drinks of alcohol per week. She reports that she does not use drugs.  Past Medical History:  Diagnosis Date  . Allergic rhinitis, cause unspecified   . Migraines    without aura    Past Surgical History:  Procedure Laterality Date  . LIPOMA EXCISION  03/2010   right flank    Current Outpatient Medications  Medication Sig Dispense Refill  . BEPREVE 1.5 % SOLN   4  . Calcium Carbonate Antacid (TUMS PO) Take by mouth daily.    . cetirizine (ZYRTEC) 10 MG tablet Take 10 mg by mouth daily.    . fluticasone (FLONASE) 50 MCG/ACT nasal spray Place 2 sprays into the nose as needed for rhinitis. 16 g 6  . Ibuprofen (ADVIL PO) Take by mouth as needed.     . Multiple Vitamins-Minerals (MULTIVITAMIN PO) Take by mouth daily.    . Norethindrone-Ethinyl Estradiol-Fe Biphas (LO LOESTRIN FE) 1 MG-10 MCG / 10 MCG tablet Take 1 tablet by mouth daily. 84 tablet 3   No current facility-administered medications for this visit.    Family History  Problem Relation Age of Onset  . Arthritis Mother   . Breast cancer Mother 74  . Prostate cancer Maternal Grandfather   . Arthritis Maternal Grandfather   . Arthritis Maternal Grandmother   . Breast cancer Maternal Grandmother 59  . Stroke Other     Review of Systems  All other systems reviewed and are negative.   Exam:   BP (!) 144/78   Pulse 100   Resp 12   Ht 5\' 1"  (1.549 m)   Wt 128 lb (58.1 kg)   LMP 10/15/2019 (Exact Date)   BMI 24.19 kg/m     General appearance: alert, cooperative and appears stated age Head: normocephalic, without obvious abnormality, atraumatic Neck: no adenopathy, supple, symmetrical, trachea midline and thyroid normal to inspection and palpation Lungs: clear to auscultation bilaterally Breasts: normal appearance, no masses or tenderness, No nipple retraction or dimpling, No nipple discharge or bleeding, No axillary adenopathy Heart: regular rate and rhythm Abdomen: soft, non-tender; no masses, no organomegaly Extremities: extremities normal, atraumatic, no cyanosis or edema Skin: skin color, texture, turgor normal. No rashes or  lesions Lymph nodes: cervical, supraclavicular, and axillary nodes normal. Neurologic: grossly normal  Pelvic: External genitalia:  no lesions              No abnormal inguinal nodes palpated.              Urethra:  normal appearing urethra with no masses, tenderness or lesions              Bartholins and Skenes: normal                 Vagina: normal appearing vagina with normal color and discharge, no lesions              Cervix: no lesions              Pap taken: No. Bimanual Exam:  Uterus:  normal size, contour, position,  consistency, mobility, non-tender              Adnexa: no mass, fullness, tenderness              Rectal exam: Yes.  .  Confirms.              Anus:  normal sphincter tone, no lesions  Chaperone was present for exam.  Tyrer Cusik model:  25.4% risk of breast cancer during her lifetime.   Assessment:   Well woman visit with normal exam. Hx low vit D.   Migraine without aura. Mother and maternal grandmother with breast cancer.  Mother tested negative twice for genetic mutation.  Increased lifetime risk of breast cancer.  Family history of prostate cancer. STD screening.   Plan: Mammogram screening discussed. Self breast awareness reviewed. Pap and HR HPV as above. New guidelines reviewed.  Guidelines for Calcium, Vitamin D, regular exercise program including cardiovascular and weight bearing exercise. Will proceed with breast MRI and consultation at the high risk breast cancer screening clinic at [facility]. Ok to continue LoLoEstrin for one year. Follow up annually and prn.    After visit summary provided.

## 2019-10-18 ENCOUNTER — Other Ambulatory Visit: Payer: Self-pay | Admitting: Obstetrics and Gynecology

## 2019-10-18 ENCOUNTER — Encounter: Payer: Self-pay | Admitting: Obstetrics and Gynecology

## 2019-10-18 ENCOUNTER — Ambulatory Visit (INDEPENDENT_AMBULATORY_CARE_PROVIDER_SITE_OTHER): Payer: 59 | Admitting: Obstetrics and Gynecology

## 2019-10-18 ENCOUNTER — Telehealth: Payer: Self-pay | Admitting: Obstetrics and Gynecology

## 2019-10-18 ENCOUNTER — Other Ambulatory Visit: Payer: Self-pay

## 2019-10-18 VITALS — BP 144/78 | HR 100 | Resp 12 | Ht 61.0 in | Wt 128.0 lb

## 2019-10-18 DIAGNOSIS — Z01419 Encounter for gynecological examination (general) (routine) without abnormal findings: Secondary | ICD-10-CM

## 2019-10-18 DIAGNOSIS — Z9189 Other specified personal risk factors, not elsewhere classified: Secondary | ICD-10-CM

## 2019-10-18 MED ORDER — LO LOESTRIN FE 1 MG-10 MCG / 10 MCG PO TABS: 1.0000 | ORAL_TABLET | Freq: Every day | ORAL | 3 refills | Status: DC

## 2019-10-18 NOTE — Telephone Encounter (Signed)
Please schedule breast MRI with contrast for my patient.   She has increased lifetime risk of breast cancer of 25.4%.  Please also make a referral to the high risk breast cancer screening clinic at the [facility].

## 2019-10-18 NOTE — Patient Instructions (Signed)

## 2019-10-18 NOTE — Telephone Encounter (Signed)
Referral and Breast MRI orders placed.   Routing to [place] for precert/referral.

## 2019-10-19 LAB — COMPREHENSIVE METABOLIC PANEL
ALT: 25 IU/L (ref 0–32)
AST: 24 IU/L (ref 0–40)
Albumin/Globulin Ratio: 1.9 (ref 1.2–2.2)
Albumin: 5 g/dL — ABNORMAL HIGH (ref 3.8–4.8)
Alkaline Phosphatase: 42 IU/L — ABNORMAL LOW (ref 48–121)
BUN/Creatinine Ratio: 14 (ref 9–23)
BUN: 12 mg/dL (ref 6–24)
Bilirubin Total: 0.9 mg/dL (ref 0.0–1.2)
CO2: 23 mmol/L (ref 20–29)
Calcium: 9.5 mg/dL (ref 8.7–10.2)
Chloride: 99 mmol/L (ref 96–106)
Creatinine, Ser: 0.85 mg/dL (ref 0.57–1.00)
GFR calc Af Amer: 94 mL/min/{1.73_m2} (ref >59)
GFR calc non Af Amer: 81 mL/min/{1.73_m2} (ref >59)
Globulin, Total: 2.6 g/dL (ref 1.5–4.5)
Glucose: 90 mg/dL (ref 65–99)
Potassium: 4.6 mmol/L (ref 3.5–5.2)
Sodium: 137 mmol/L (ref 134–144)
Total Protein: 7.6 g/dL (ref 6.0–8.5)

## 2019-10-19 LAB — LIPID PANEL
Chol/HDL Ratio: 3.1 ratio (ref 0.0–4.4)
Cholesterol, Total: 221 mg/dL — ABNORMAL HIGH (ref 100–199)
HDL: 71 mg/dL (ref >39)
LDL Chol Calc (NIH): 129 mg/dL — ABNORMAL HIGH (ref 0–99)
Triglycerides: 122 mg/dL (ref 0–149)
VLDL Cholesterol Cal: 21 mg/dL (ref 5–40)

## 2019-10-19 LAB — CBC
Hematocrit: 42.2 % (ref 34.0–46.6)
Hemoglobin: 14.3 g/dL (ref 11.1–15.9)
MCH: 31.6 pg (ref 26.6–33.0)
MCHC: 33.9 g/dL (ref 31.5–35.7)
MCV: 93 fL (ref 79–97)
Platelets: 285 10*3/uL (ref 150–450)
RBC: 4.53 x10E6/uL (ref 3.77–5.28)
RDW: 12 % (ref 11.7–15.4)
WBC: 5.6 10*3/uL (ref 3.4–10.8)

## 2019-10-19 LAB — TSH: TSH: 1.34 u[IU]/mL (ref 0.450–4.500)

## 2019-10-19 LAB — VITAMIN D 25 HYDROXY (VIT D DEFICIENCY, FRACTURES): Vit D, 25-Hydroxy: 23.2 ng/mL — ABNORMAL LOW (ref 30.0–100.0)

## 2019-10-30 ENCOUNTER — Telehealth: Payer: Self-pay | Admitting: Hematology and Oncology

## 2019-10-30 NOTE — Telephone Encounter (Signed)
Received a referral from Dr. Edward Jolly for Alyssa Livingston to be seen in the high risk clinic. Ms. Batterman has been cld and scheduled to see Dr. Pamelia Hoit on 9/13 at 345pm. Pt aware to arrive 15 minutes early.

## 2019-11-07 MED FILL — LO LOESTRIN FE 1-10 TABLET: 1 MG-10 MCG | 28 days supply | Qty: 28 | Fill #0

## 2019-11-26 NOTE — Progress Notes (Signed)
[facility] CONSULT NOTE  Patient Care Team: Patient, No Pcp Per as PCP - General (General Practice) Darnell Level, MD (General Surgery) Romine, Edwena Felty, MD (Obstetrics and Gynecology)  CHIEF COMPLAINTS/PURPOSE OF CONSULTATION:  Newly diagnosed high risk for breast cancer  HISTORY OF PRESENTING ILLNESS:  Alyssa Livingston 49 y.o. female is here because of recent diagnosis of high risk for breast cancer. She is referred by Dr. Edward Jolly. She presents to the clinic today for initial evaluation and discussion of surveillance options.  Patient has extensive family history of breast cancer in both mother and grandmother.  Mother was genetically tested and was found to be negative twice.  She has been getting mammograms since age 20 and has not had any problems.  Tyra Cusick risk classification put her at high risk and therefore she was referred to [place].  Dr. Edward Jolly has ordered a breast MRI.  I reviewed her records extensively and collaborated the history with the patient.  MEDICAL HISTORY:  Past Medical History:  Diagnosis Date  . Allergic rhinitis, cause unspecified   . Increased risk of breast cancer   . Migraines    without aura    SURGICAL HISTORY: Past Surgical History:  Procedure Laterality Date  . LIPOMA EXCISION  03/2010   right flank    SOCIAL HISTORY: Social History   Socioeconomic History  . Marital status: Divorced    Spouse name: Not on file  . Number of children: Not on file  . Years of education: Not on file  . Highest education level: Not on file  Occupational History  . Not on file  Tobacco Use  . Smoking status: Never Smoker  . Smokeless tobacco: Never Used  . Tobacco comment: works at [employer], divorced, lives with kid  Vaping Use  . Vaping Use: Never used  Substance and Sexual Activity  . Alcohol use: Yes    Alcohol/week: 2.0 standard drinks    Types: 2 Glasses of wine per week    Comment: 1-2 glasses of wine a week  . Drug use: No  . Sexual  activity: Yes    Partners: Male    Birth control/protection: Pill    Comment: LoLoestrin  Other Topics Concern  . Not on file  Social History Narrative  . Not on file   Social Determinants of Health   Financial Resource Strain:   . Difficulty of Paying Living Expenses: Not on file  Food Insecurity:   . Worried About Programme researcher, broadcasting/film/video in the Last Year: Not on file  . Ran Out of Food in the Last Year: Not on file  Transportation Needs:   . Lack of Transportation (Medical): Not on file  . Lack of Transportation (Non-Medical): Not on file  Physical Activity:   . Days of Exercise per Week: Not on file  . Minutes of Exercise per Session: Not on file  Stress:   . Feeling of Stress : Not on file  Social Connections:   . Frequency of Communication with Friends and Family: Not on file  . Frequency of Social Gatherings with Friends and Family: Not on file  . Attends Religious Services: Not on file  . Active Member of Clubs or Organizations: Not on file  . Attends Banker Meetings: Not on file  . Marital Status: Not on file  Intimate Partner Violence:   . Fear of Current or Ex-Partner: Not on file  . Emotionally Abused: Not on file  . Physically Abused: Not  on file  . Sexually Abused: Not on file    FAMILY HISTORY: Family History  Problem Relation Age of Onset  . Arthritis Mother   . Breast cancer Mother 69  . Prostate cancer Maternal Grandfather   . Arthritis Maternal Grandfather   . Arthritis Maternal Grandmother   . Breast cancer Maternal Grandmother 70  . Stroke Other     ALLERGIES:  has No Known Allergies.  MEDICATIONS:  Current Outpatient Medications  Medication Sig Dispense Refill  . BEPREVE 1.5 % SOLN   4  . Calcium Carbonate Antacid (TUMS PO) Take by mouth daily.    . cetirizine (ZYRTEC) 10 MG tablet Take 10 mg by mouth daily.    . fluticasone (FLONASE) 50 MCG/ACT nasal spray Place 2 sprays into the nose as needed for rhinitis. 16 g 6  .  Ibuprofen (ADVIL PO) Take by mouth as needed.    . Multiple Vitamins-Minerals (MULTIVITAMIN PO) Take by mouth daily.    . Norethindrone-Ethinyl Estradiol-Fe Biphas (LO LOESTRIN FE) 1 MG-10 MCG / 10 MCG tablet Take 1 tablet by mouth daily. 84 tablet 3   No current facility-administered medications for this visit.    REVIEW OF SYSTEMS:     All other systems were reviewed with the patient and are negative.  PHYSICAL EXAMINATION: ECOG PERFORMANCE STATUS: 1 - Symptomatic but completely ambulatory  Vitals:   11/27/19 1534  BP: (!) 153/76  Pulse: (!) 111  Resp: 18  SpO2: 100%   Filed Weights   11/27/19 1534  Weight: 115 lb 8 oz (52.4 kg)       LABORATORY DATA:  I have reviewed the data as listed Lab Results  Component Value Date   WBC 5.6 10/18/2019   HGB 14.3 10/18/2019   HCT 42.2 10/18/2019   MCV 93 10/18/2019   PLT 285 10/18/2019   Lab Results  Component Value Date   NA 137 10/18/2019   K 4.6 10/18/2019   CL 99 10/18/2019   CO2 23 10/18/2019    RADIOGRAPHIC STUDIES: I have personally reviewed the radiological reports and agreed with the findings in the report.  ASSESSMENT AND PLAN:  Increased risk of breast cancer Tyrer Cusik model:  25.4% risk of breast cancer during her lifetime. (Mother and grandmother with history of breast cancer) Mother tested negative for genetic mutations.    Risk reduction: We discussed pharmacological and nonpharmacological risk reduction measures. We discussed the pros and cons of tamoxifen and raloxifene in reducing her risk. We also discussed exercise, diet, healthy eating habits, reducing alcohol consumption, and role of supplements in reduction of breast cancer risk.  Tamoxifen counseling: We discussed the risks and benefits of tamoxifen. These include but not limited to insomnia, hot flashes, mood changes, vaginal dryness, and weight gain. Although rare, serious side effects including endometrial cancer, risk of blood clots were  also discussed.   After discussion, patient decided that she would not like to take antiestrogen therapy.   Breast cancer surveillance: 1.  Annual mammograms 2.  Annual breast MRIs  Patient wishes to follow with Dr. Edward Jolly for her imaging studies.  I discussed with her that they could be done 6 months apart. We are happy to see her on an as-needed basis. She exercises very regularly.  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  All questions were answered. The patient knows to call the clinic with any problems, questions or concerns.   Sabas Sous, MD, MPH 11/27/2019    I, Kirt Boys  Dorshimer, am acting as scribe for Nicholas Lose, MD.  I have reviewed the above documentation for accuracy and completeness, and I agree with the above.

## 2019-11-27 ENCOUNTER — Inpatient Hospital Stay: Payer: 59 | Attending: Hematology and Oncology | Admitting: Hematology and Oncology

## 2019-11-27 ENCOUNTER — Other Ambulatory Visit: Payer: Self-pay

## 2019-11-27 DIAGNOSIS — Z8042 Family history of malignant neoplasm of prostate: Secondary | ICD-10-CM | POA: Insufficient documentation

## 2019-11-27 DIAGNOSIS — Z803 Family history of malignant neoplasm of breast: Secondary | ICD-10-CM | POA: Diagnosis not present

## 2019-11-27 DIAGNOSIS — Z1239 Encounter for other screening for malignant neoplasm of breast: Secondary | ICD-10-CM | POA: Insufficient documentation

## 2019-11-27 DIAGNOSIS — Z9189 Other specified personal risk factors, not elsewhere classified: Secondary | ICD-10-CM

## 2019-11-27 NOTE — Assessment & Plan Note (Signed)
Tyrer Cusik model:  25.4% risk of breast cancer during her lifetime. (Mother and grandmother with history of breast cancer) Mother tested negative for genetic mutations.  Risk reduction: We discussed pharmacological and nonpharmacological risk reduction measures. We discussed the pros and cons of tamoxifen in reducing her risk. We also discussed exercise, diet, healthy eating habits, reducing alcohol consumption, and role of supplements in reduction of breast cancer risk.  Tamoxifen counseling: We discussed the risks and benefits of tamoxifen. These include but not limited to insomnia, hot flashes, mood changes, vaginal dryness, and weight gain. Although rare, serious side effects including endometrial cancer, risk of blood clots were also discussed. We strongly believe that the benefits far outweigh the risks. Patient understands these risks and consented to starting treatment. Planned treatment duration is 5 years.  Breast cancer surveillance: 1.  Annual mammograms 2.  Annual breast MRIs

## 2019-11-28 ENCOUNTER — Telehealth: Payer: Self-pay | Admitting: Hematology and Oncology

## 2019-11-28 NOTE — Telephone Encounter (Signed)
No 9/13 los, no changes made to pt schedule  

## 2019-12-04 MED FILL — LO LOESTRIN FE 1-10 TABLET: 1 MG-10 MCG | 28 days supply | Qty: 28 | Fill #1

## 2019-12-21 DIAGNOSIS — H5213 Myopia, bilateral: Secondary | ICD-10-CM | POA: Diagnosis not present

## 2019-12-21 MED FILL — BEPREVE 1.5% EYE DROPS: 1.5 | 25 days supply | Qty: 5 | Fill #0

## 2019-12-27 MED FILL — LO LOESTRIN FE 1-10 TABLET: 1 MG-10 MCG | 28 days supply | Qty: 28 | Fill #2

## 2019-12-29 ENCOUNTER — Encounter: Payer: Self-pay | Admitting: Obstetrics and Gynecology

## 2020-01-01 ENCOUNTER — Telehealth: Payer: Self-pay | Admitting: *Deleted

## 2020-01-01 NOTE — Telephone Encounter (Signed)
Alyssa Livingston  [facility] Hi. I have a screening mri scheduled on November 9th I believe. However, my question is about timing of such after having the Covid booster. I had my booster on Oct 8th with definite noted lymph node swelling on the side where I had the booster. Should I wait at least 6wks before getting the MRI? Just let me know if I should push out that appointment. Thank you.     Call placed to [facility], spoke with [employer]. Current recommendations for screening breast MRI or breast imaging are 4-6 wks after receiving Covid 19 vaccine or booster.   Call placed to patient. Advised as seen above. Patient agreeable, she will call to r/s screening breast MRI to a later date.   Patient also requesting PA for Lo Loestrin to be submitted to plan. Patient states RX sent for 3 mo supply, can only dispense as 1 mo supply. Advised I will contact [facility] pharmacy to confirm what is needed and assist with submitting forms, if needed. Patient agreeable.

## 2020-01-01 NOTE — Telephone Encounter (Signed)
Call placed to [facility] pharmacy, spoke with Amberly. Was advised PA needed for LoLoestrin, not covered by plan, using manufacture coupon for monthly supply.

## 2020-01-01 NOTE — Telephone Encounter (Signed)
Drucilla Chalet KeyHarmon Dun - PA Case ID: [id number] PA submitted to plan for LoLoestrin on 01/01/20 via [email or web address]

## 2020-01-01 NOTE — Telephone Encounter (Signed)
See telephone encounter dated 01/01/20.   Encounter closed.  

## 2020-01-03 NOTE — Telephone Encounter (Signed)
Fax received from [employer], additional information required for [employer].   Form completed and faxed to [employer].

## 2020-01-09 NOTE — Telephone Encounter (Signed)
Left message to call Meagan Spease, RN at [facility] [phone].   

## 2020-01-09 NOTE — Telephone Encounter (Signed)
Patient returned call

## 2020-01-09 NOTE — Telephone Encounter (Signed)
Spoke with patient, advised per Dr. Edward Jolly. Patient is agreeable to plan.  Patient states she has to call and r/s her breast MRI scheduled for 11/2 due to receiving her Covid19 booster on 10/8. Patient will return call to the office after she r/s her MRI to schedule an OV with Dr. Edward Jolly. Patient thankful for call.    Will leave encounter open until f/u OV scheduled.

## 2020-01-09 NOTE — Telephone Encounter (Signed)
I received the note that LoLoestrin PA was denied.   On chart review, I see that the patient's blood pressure was elevated at her visit with oncology.   It may be time to make a change in her medication.   I would like to do an office visit to discuss options.  I see she has an upcoming breast MRI.  Would she like to come in after having that done?

## 2020-01-16 ENCOUNTER — Other Ambulatory Visit: Payer: 59

## 2020-01-16 NOTE — Telephone Encounter (Signed)
Per review of Epic, Breast MRI r/s to 02/04/20.

## 2020-01-17 ENCOUNTER — Other Ambulatory Visit (HOSPITAL_COMMUNITY): Payer: Self-pay | Admitting: Optometry

## 2020-01-17 MED FILL — EPINASTINE HCL 0.05% EYE DR: 0.05 | 25 days supply | Qty: 5 | Fill #0

## 2020-01-17 NOTE — Telephone Encounter (Signed)
Left message to call Noreene Larsson, RN at [facility] [phone].   F/u on scheduling OV with Dr. Edward Jolly.

## 2020-01-24 NOTE — Telephone Encounter (Signed)
Dr. Edward Jolly -patient has not returned call to schedule f/u OV, please advise. Patient was notified of recommendations during 01/09/20 telephone call.

## 2020-01-25 MED FILL — LO LOESTRIN FE 1-10 TABLET: 1 MG-10 MCG | 28 days supply | Qty: 28 | Fill #3

## 2020-01-25 NOTE — Telephone Encounter (Signed)
Encounter closed

## 2020-01-25 NOTE — Telephone Encounter (Signed)
Patient has been advised.  OK to close encounter.

## 2020-02-01 ENCOUNTER — Other Ambulatory Visit: Payer: Self-pay | Admitting: Obstetrics and Gynecology

## 2020-02-01 DIAGNOSIS — Z1231 Encounter for screening mammogram for malignant neoplasm of breast: Secondary | ICD-10-CM

## 2020-02-02 ENCOUNTER — Ambulatory Visit
Admission: RE | Admit: 2020-02-02 | Discharge: 2020-02-02 | Disposition: A | Discharge status: Home / Self Care | Payer: 59 | Source: Ambulatory Visit | Attending: Obstetrics and Gynecology | Admitting: Obstetrics and Gynecology

## 2020-02-02 ENCOUNTER — Other Ambulatory Visit: Payer: Self-pay

## 2020-02-02 DIAGNOSIS — Z1231 Encounter for screening mammogram for malignant neoplasm of breast: Secondary | ICD-10-CM | POA: Diagnosis not present

## 2020-02-02 IMAGING — MG DIGITAL SCREENING BILAT W/ TOMO W/ CAD
8 series · 9 of 24 positions shown · non-contrast
Comparison: Previous exam(s).

CLINICAL DATA: Screening.

EXAM:
DIGITAL SCREENING BILATERAL MAMMOGRAM WITH TOMO AND CAD

[L CC synth-2D]
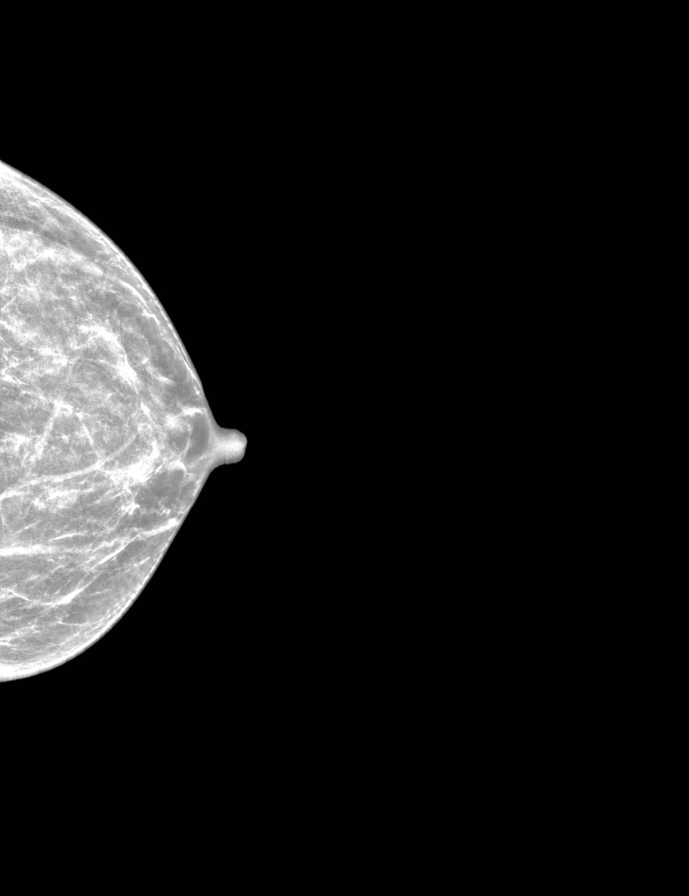

[R CC synth-2D]
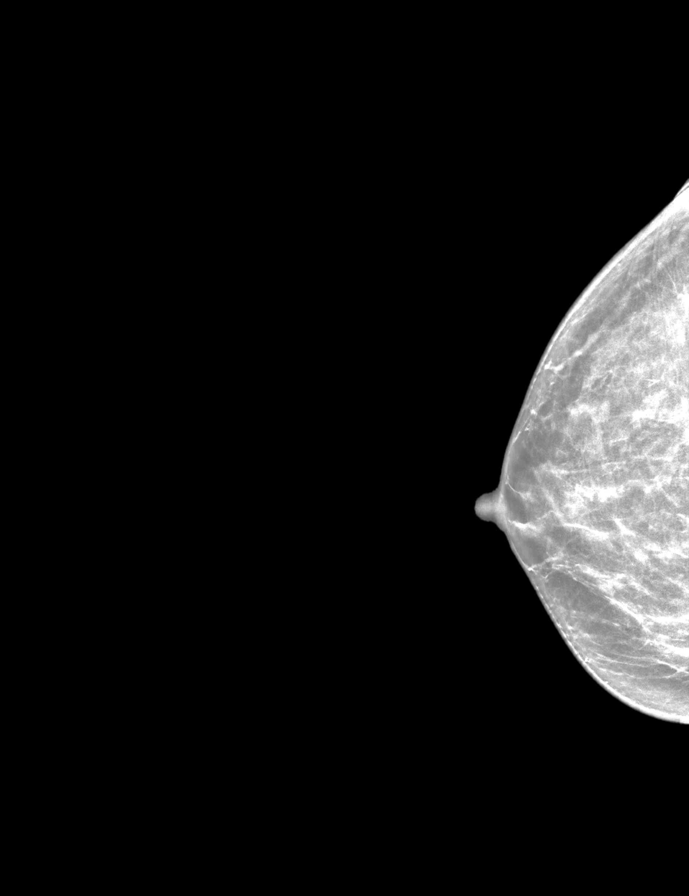

[R MLO synth-2D]
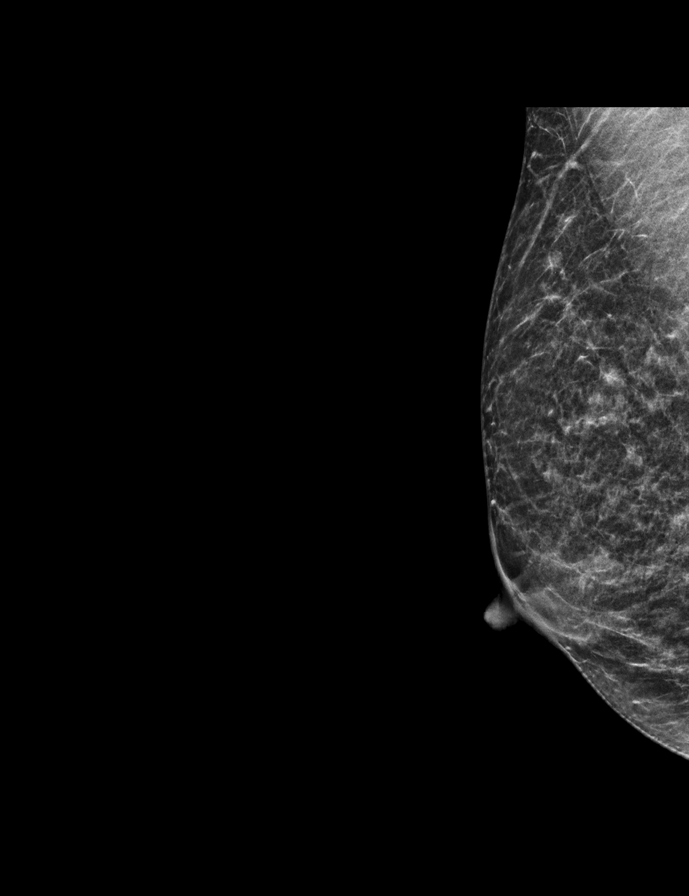

[L MLO synth-2D]
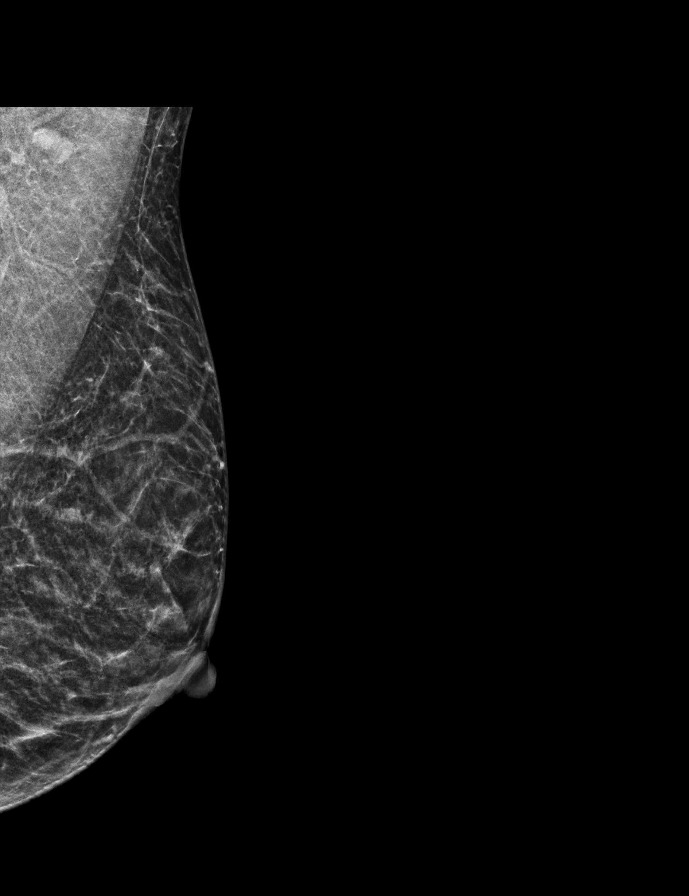

[R CC tomo · 2 of 51 frames shown]
[frame 17/51]
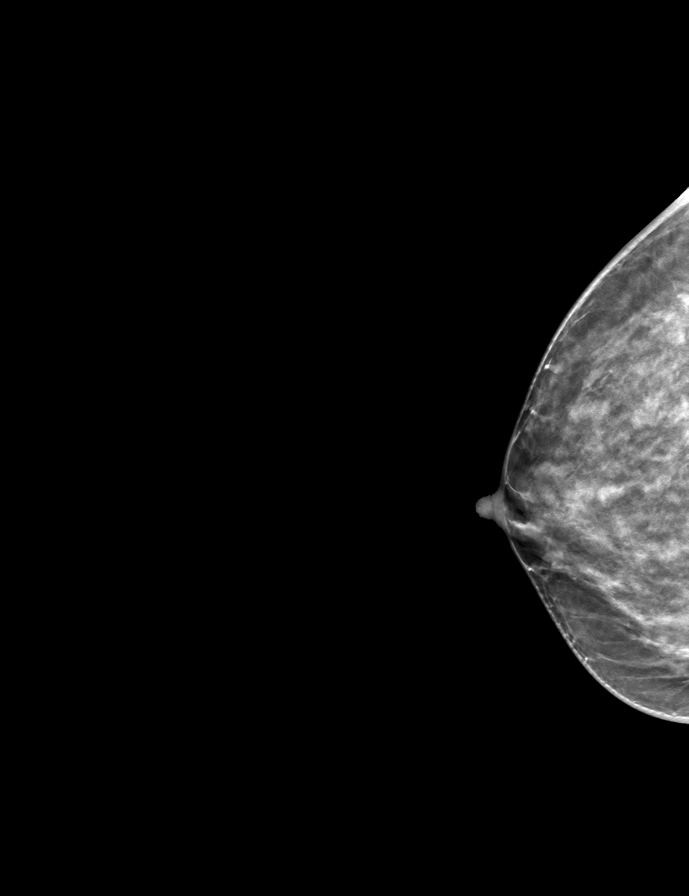
[frame 26/51]
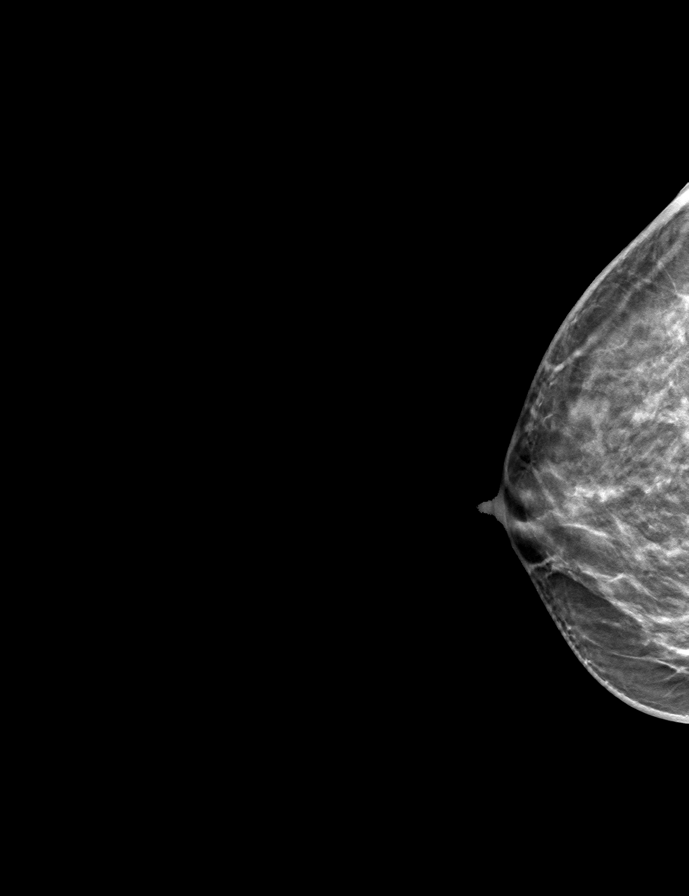

[L MLO tomo · tomo slice 20/39.0]
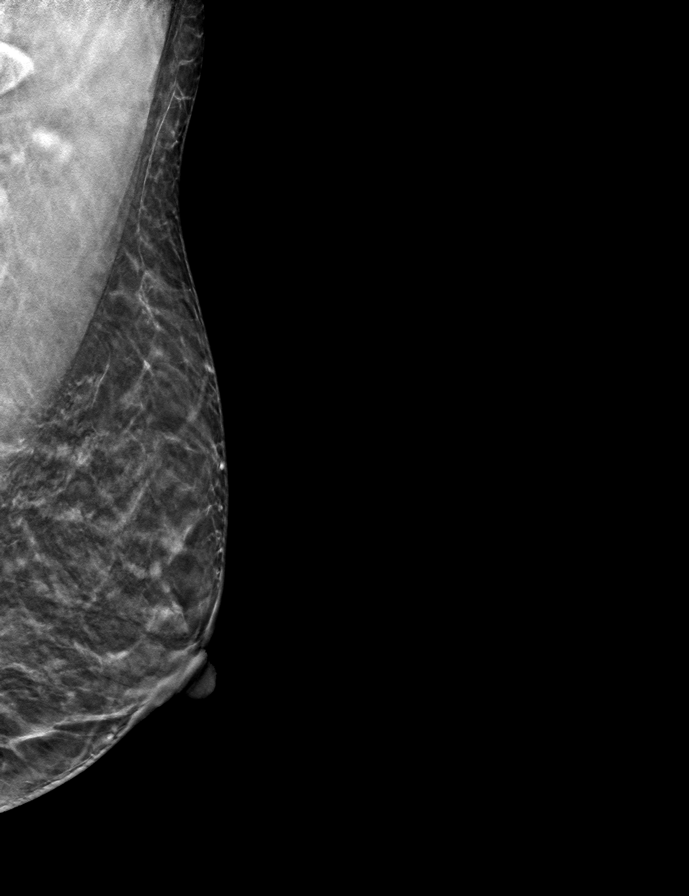

[L CC tomo · tomo slice 23/44.0]
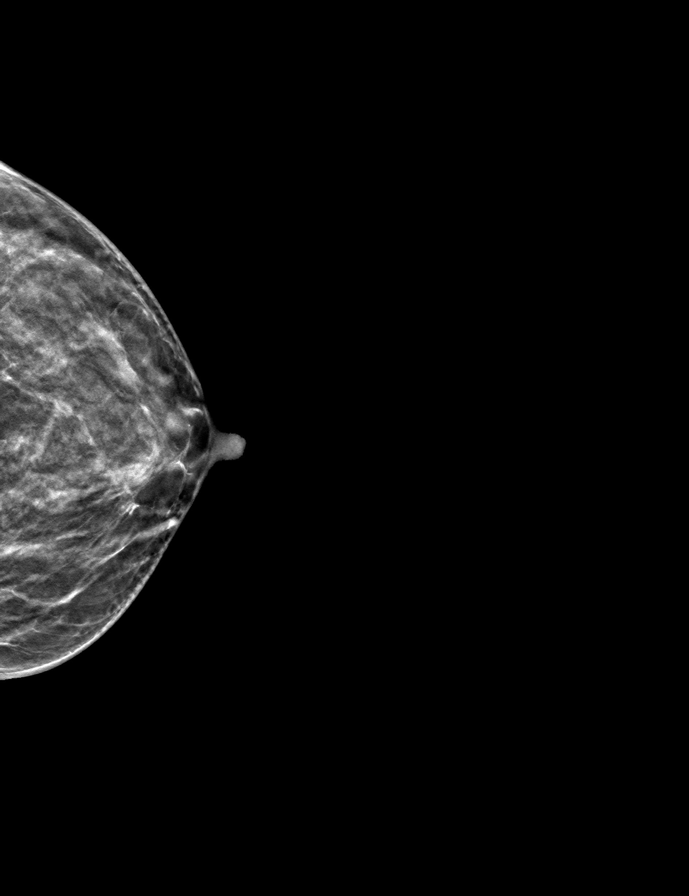

[R MLO tomo · tomo slice 20/39.0]
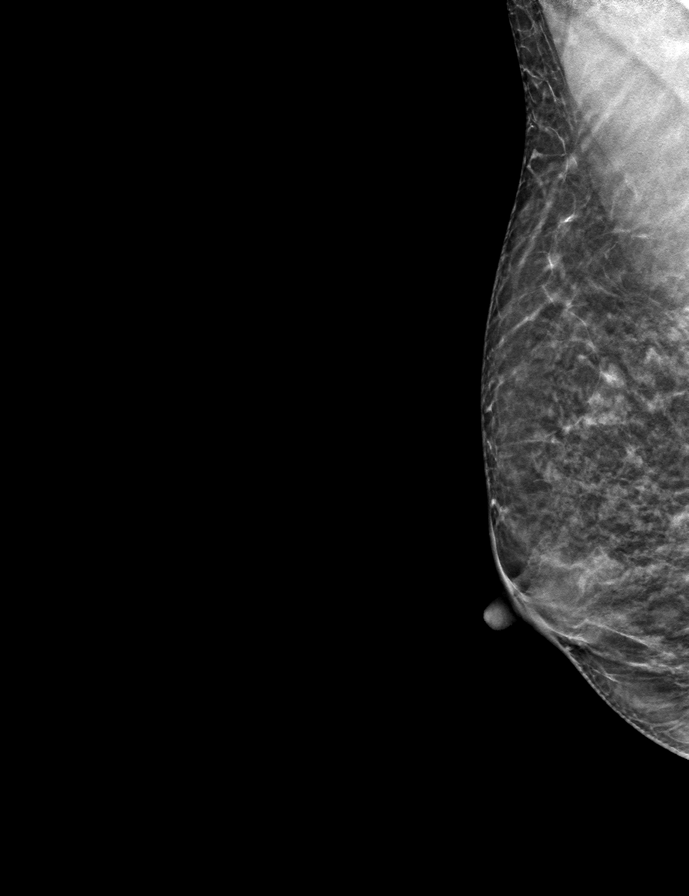

[9 of 24 positions shown; findings below may reference images not displayed]

ACR Breast Density Category c: The breast tissue is heterogeneously
dense, which may obscure small masses.
FINDINGS: In the right breast, a possible asymmetry warrants further
evaluation. In the left breast, no findings suspicious for
malignancy. Images were processed with CAD.
IMPRESSION: Further evaluation is suggested for possible asymmetry in the right
breast.

RECOMMENDATION:
Diagnostic mammogram and possibly ultrasound of the right breast.
(Code:[5V])

The patient will be contacted regarding the findings, and additional
imaging will be scheduled.

BI-RADS CATEGORY  0: Incomplete. Need additional imaging evaluation
and/or prior mammograms for comparison.

## 2020-02-04 ENCOUNTER — Other Ambulatory Visit: Payer: 59

## 2020-02-07 ENCOUNTER — Other Ambulatory Visit: Payer: Self-pay | Admitting: Obstetrics and Gynecology

## 2020-02-07 DIAGNOSIS — R928 Other abnormal and inconclusive findings on diagnostic imaging of breast: Secondary | ICD-10-CM

## 2020-02-13 ENCOUNTER — Ambulatory Visit: Payer: 59

## 2020-02-13 ENCOUNTER — Ambulatory Visit
Admission: RE | Admit: 2020-02-13 | Discharge: 2020-02-13 | Disposition: A | Discharge status: Home / Self Care | Payer: 59 | Source: Ambulatory Visit | Attending: Obstetrics and Gynecology | Admitting: Obstetrics and Gynecology

## 2020-02-13 ENCOUNTER — Other Ambulatory Visit: Payer: Self-pay

## 2020-02-13 DIAGNOSIS — R928 Other abnormal and inconclusive findings on diagnostic imaging of breast: Secondary | ICD-10-CM

## 2020-02-13 DIAGNOSIS — R922 Inconclusive mammogram: Secondary | ICD-10-CM | POA: Diagnosis not present

## 2020-02-13 IMAGING — MG MM DIGITAL DIAGNOSTIC UNILAT*R* W/ TOMO W/ CAD
4 series · 4 of 12 positions shown · non-contrast
Comparison: Previous exam(s).

CLINICAL DATA: The patient was called back for a right breast
asymmetry.

EXAM:
DIGITAL DIAGNOSTIC UNILATERAL RIGHT MAMMOGRAM WITH TOMO AND CAD

[R ML synth-2D]
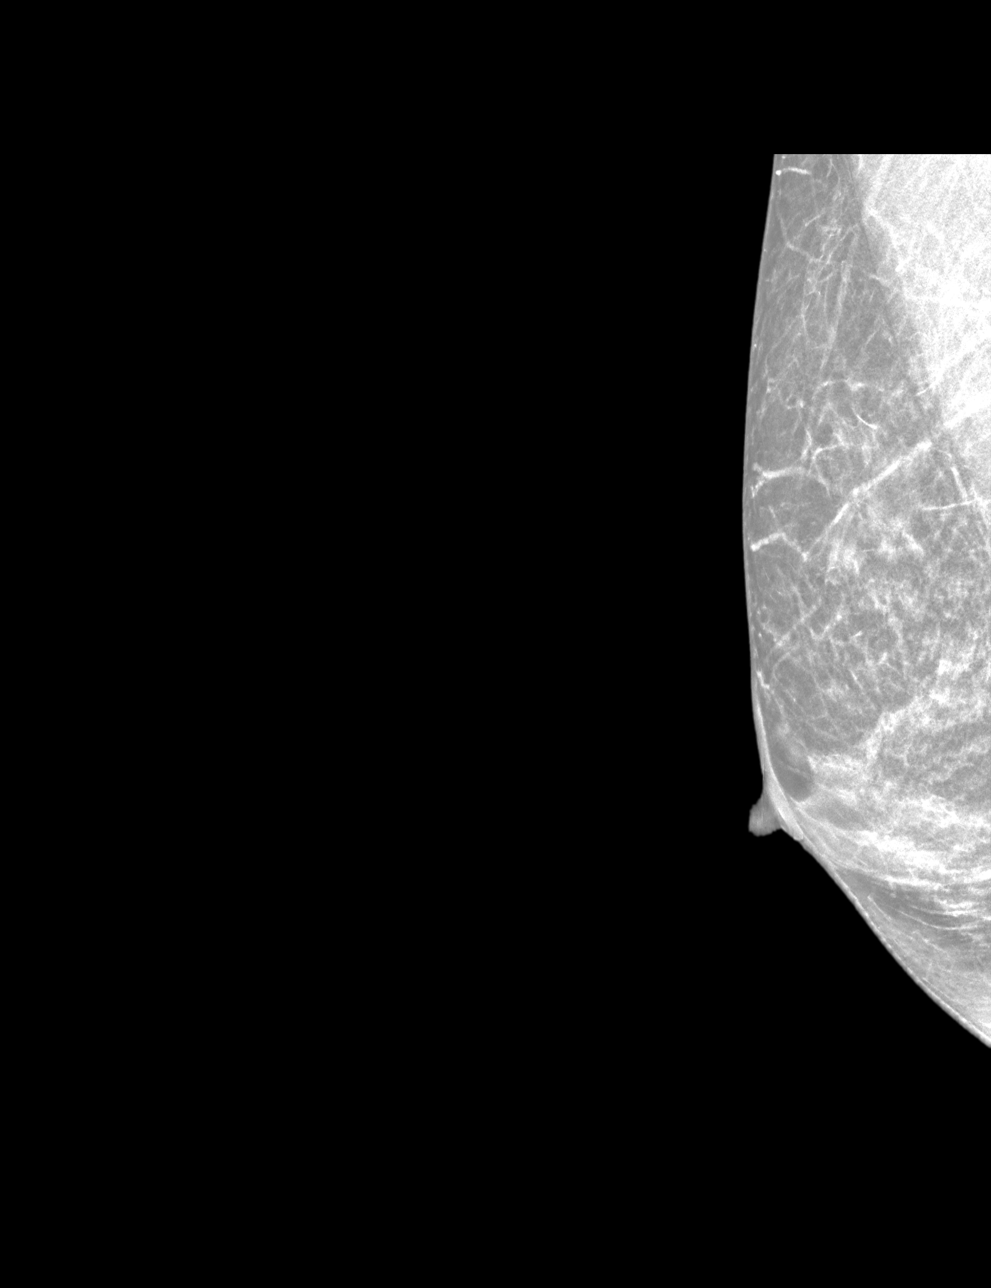

[R MLO synth-2D]
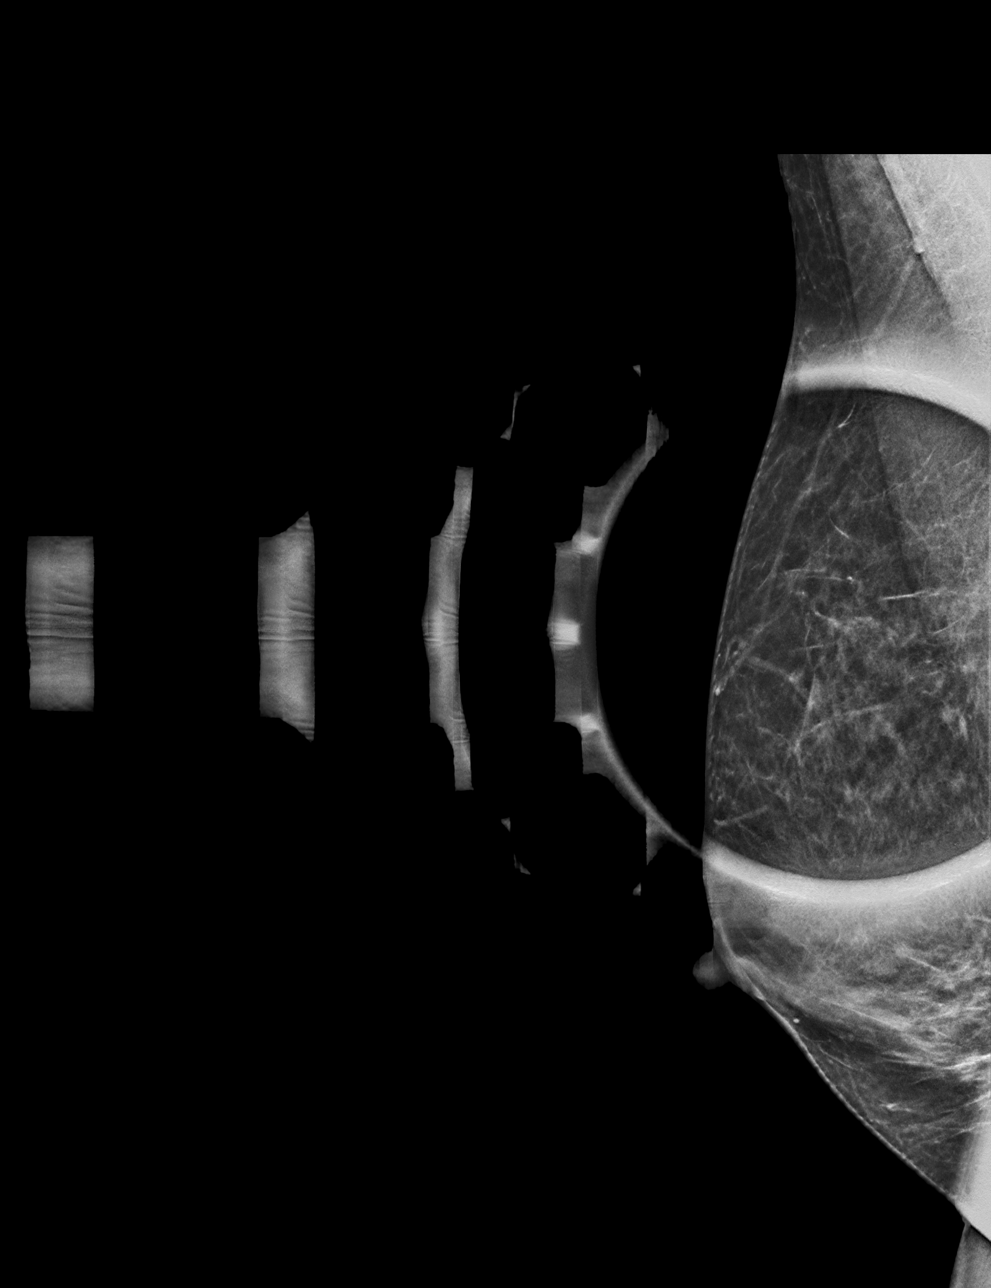

[R MLO tomo · tomo slice 21/42.0]
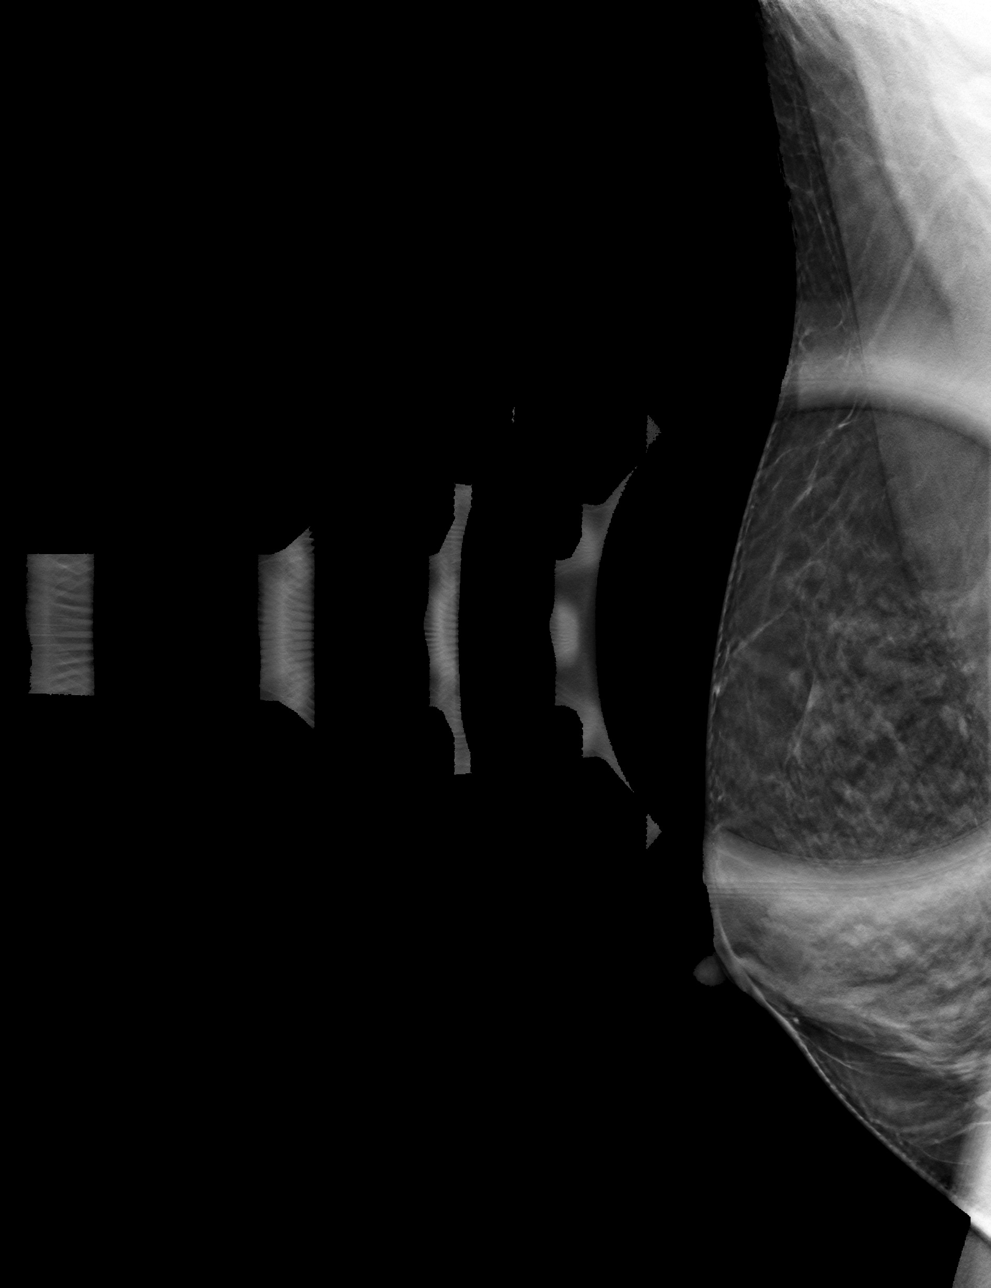

[R ML tomo · tomo slice 22/43.0]
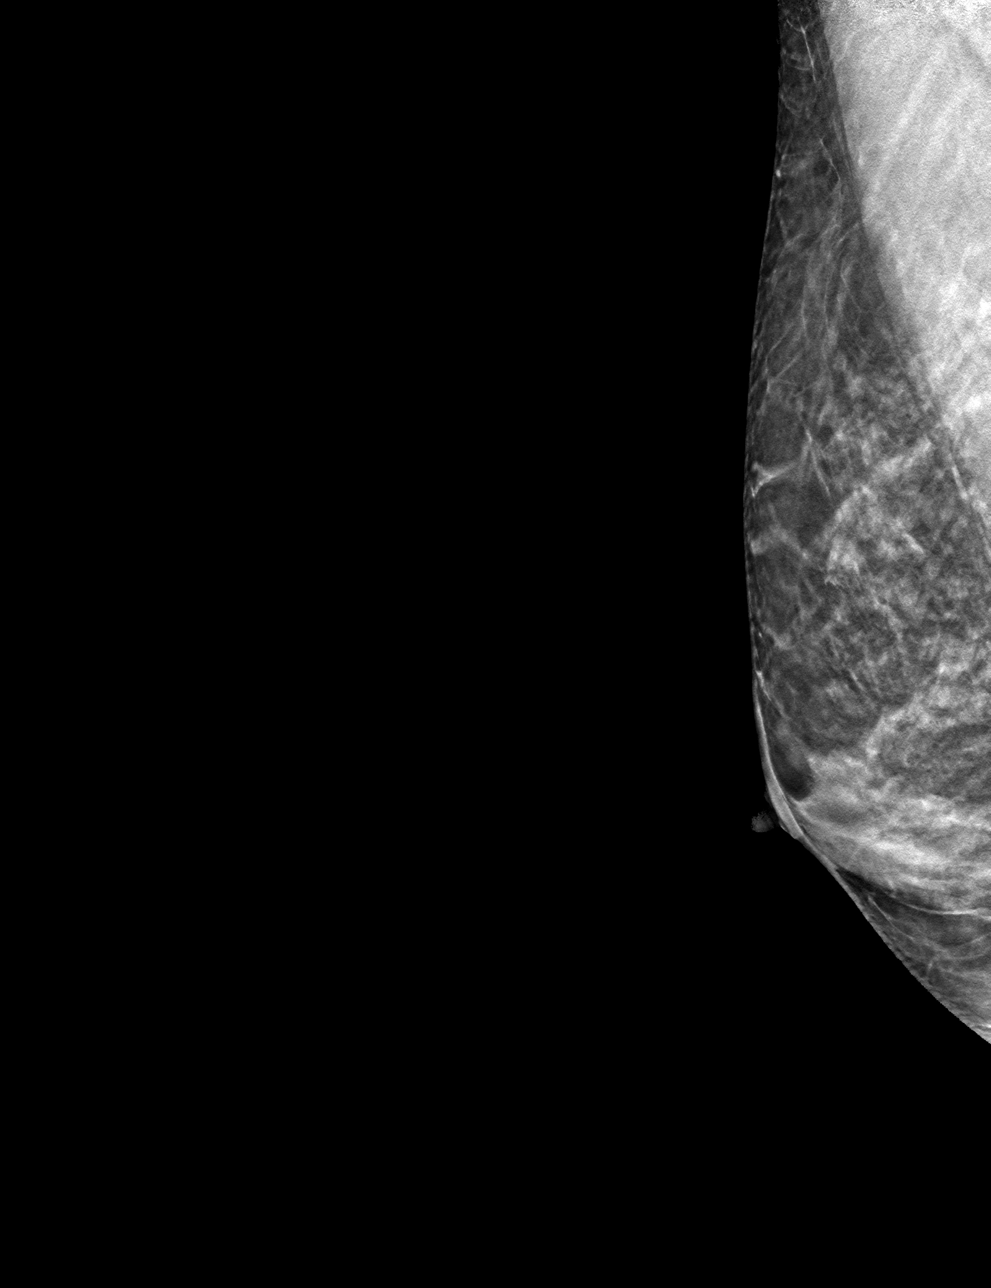

[4 of 12 positions shown; findings below may reference images not displayed]

ACR Breast Density Category c: The breast tissue is heterogeneously
dense, which may obscure small masses.
FINDINGS: The right breast asymmetry resolves on today's imaging. No
suspicious findings are identified.

Mammographic images were processed with CAD.
IMPRESSION: No mammographic evidence of malignancy. Resolution of right breast
asymmetry.

RECOMMENDATION:
Annual screening mammography. The patient is also high risk for
breast cancer, by report, based on a risk model. The patient's
mother and maternal grandmother both had breast cancer. Recommend
annual breast MRI as well.

I have discussed the findings and recommendations with the patient.
If applicable, a reminder letter will be sent to the patient
regarding the next appointment.

BI-RADS CATEGORY  1: Negative.

## 2020-02-26 MED FILL — LO LOESTRIN FE 1-10 TABLET: 1 MG-10 MCG | 28 days supply | Qty: 28 | Fill #4

## 2020-03-01 ENCOUNTER — Other Ambulatory Visit: Payer: 59

## 2020-03-01 ENCOUNTER — Other Ambulatory Visit: Payer: Self-pay

## 2020-03-01 ENCOUNTER — Encounter: Payer: Self-pay | Admitting: Obstetrics and Gynecology

## 2020-03-01 ENCOUNTER — Ambulatory Visit
Admission: RE | Admit: 2020-03-01 | Discharge: 2020-03-01 | Disposition: A | Discharge status: Home / Self Care | Payer: 59 | Source: Ambulatory Visit | Attending: Obstetrics and Gynecology | Admitting: Obstetrics and Gynecology

## 2020-03-01 ENCOUNTER — Telehealth: Payer: Self-pay | Admitting: Obstetrics and Gynecology

## 2020-03-01 DIAGNOSIS — Z9189 Other specified personal risk factors, not elsewhere classified: Secondary | ICD-10-CM

## 2020-03-01 DIAGNOSIS — R928 Other abnormal and inconclusive findings on diagnostic imaging of breast: Secondary | ICD-10-CM

## 2020-03-01 DIAGNOSIS — N6489 Other specified disorders of breast: Secondary | ICD-10-CM | POA: Diagnosis not present

## 2020-03-01 IMAGING — MR MR BREAST BILAT WO/W CM
8 of 13 series · 33 of 48 positions shown · IV contrast (Multihance)
Comparison: Previous exam(s).

CLINICAL DATA: High risk screening breast MRI with calculated
lifetime risk of breast cancer 25.4%. Family history breast cancer
in mother at age 48 and maternal grandmother.

LABS:  None.
EXAM:
BILATERAL BREAST MRI WITH AND WITHOUT CONTRAST
TECHNIQUE: Multiplanar, multisequence MR images of both breasts were obtained
prior to and following the intravenous administration of 6 ml of
Gadavist.

[Series 2: t2_tirm_tra ipat (a-p) · axial · 3.0mm · 0.62mm/px · 1 of 55 slices shown]
[im 1/55]
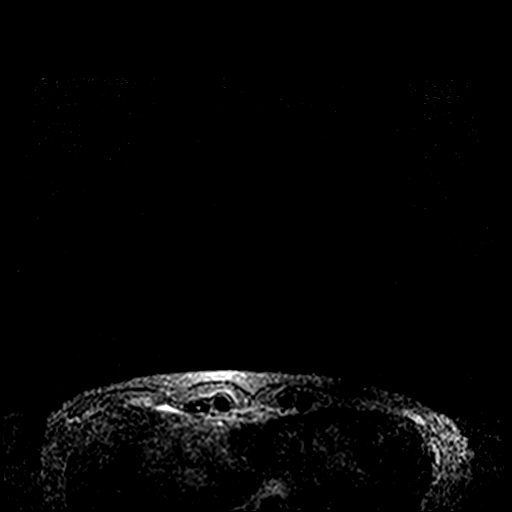

[Series 3: fl3d pre-cm no · axial · non-contrast · 1.2mm · 0.83mm/px · z∈[-62,+110]mm · 5 of 144 slices shown]
[im 1/144]
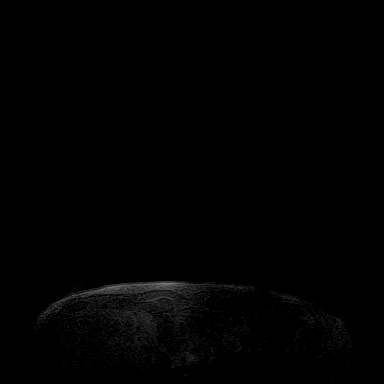
[im 36/144]
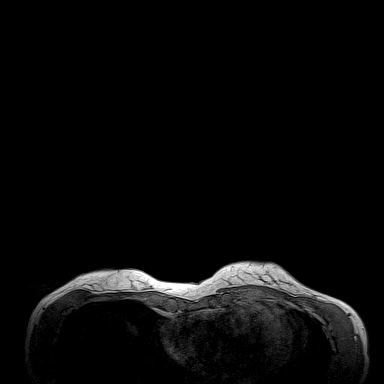
[im 72/144]
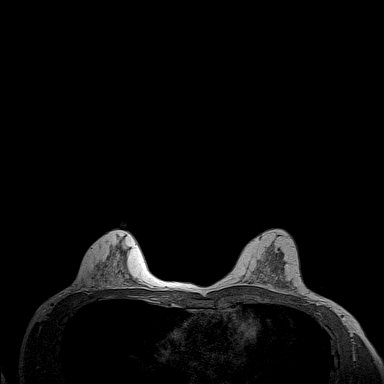
[im 108/144]
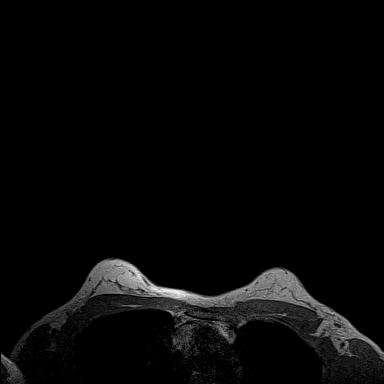
[im 144/144]
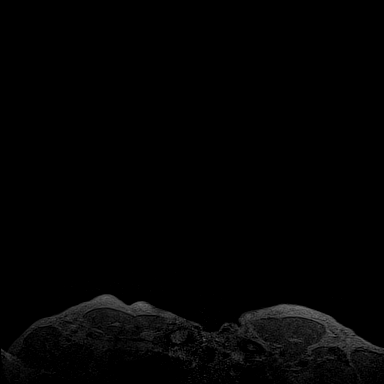

[Series 4: fl3d pre-cm · axial · non-contrast · 1.2mm · 0.83mm/px · z∈[-62,+110]mm · 5 of 144 slices shown]
[im 1/144]
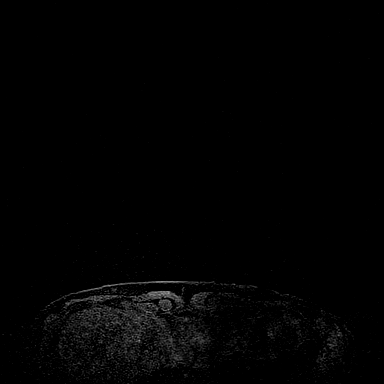
[im 36/144]
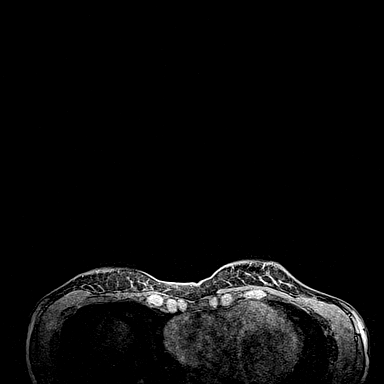
[im 72/144]
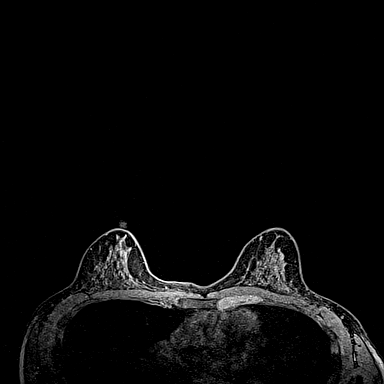
[im 108/144]
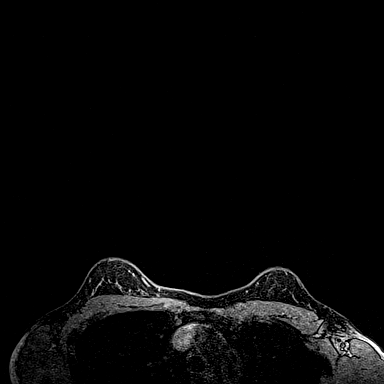
[im 144/144]
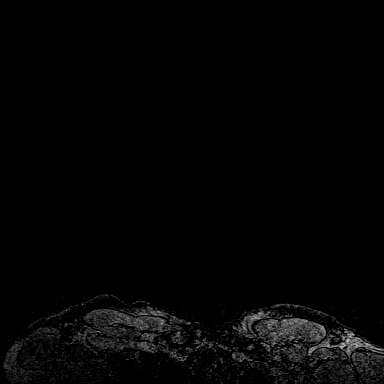

[Series 5: fl3d post-cm 20 · axial · 1.2mm · 0.83mm/px · z∈[-62,+110]mm · 5 of 144 slices shown (1 of 3)]
[im 1/144]
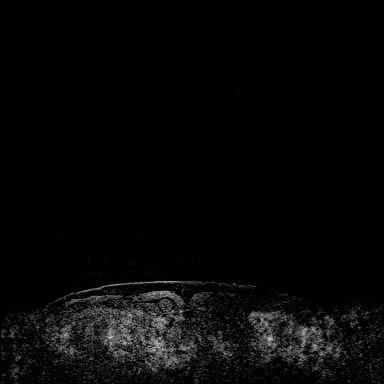
[im 36/144]
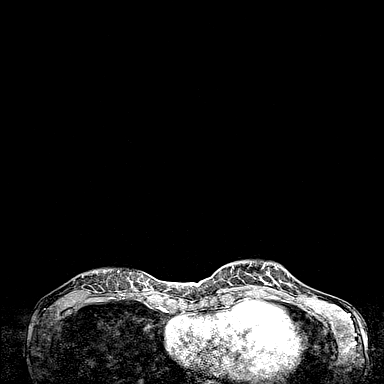
[im 72/144]
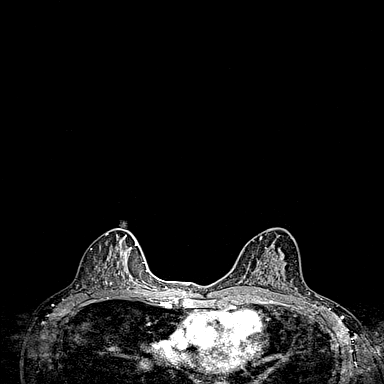
[im 108/144]
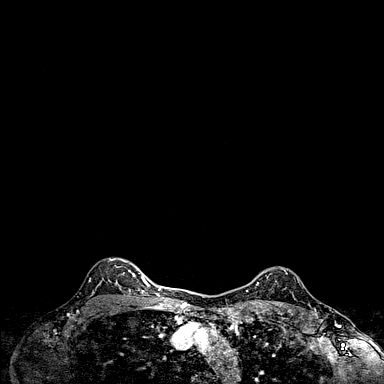
[im 144/144]
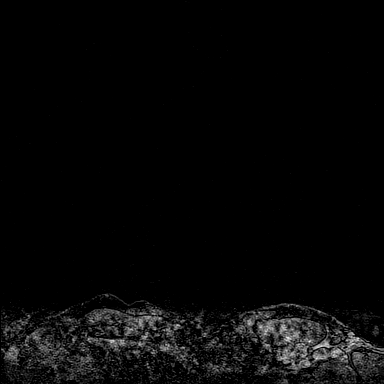

[Series 6: fl3d post-cm 20 · axial · 1.2mm · 0.83mm/px · z∈[-62,+110]mm · 5 of 144 slices shown (2 of 3)]
[im 1/144]
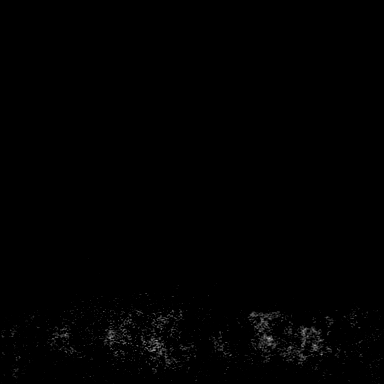
[im 36/144]
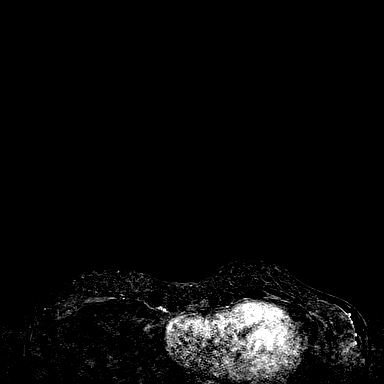
[im 72/144]
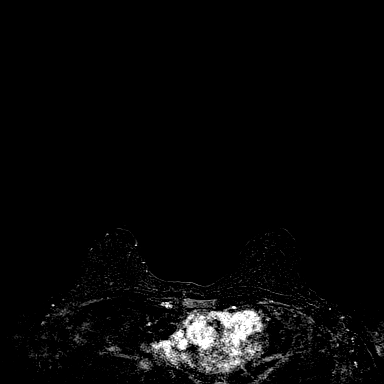
[im 108/144]
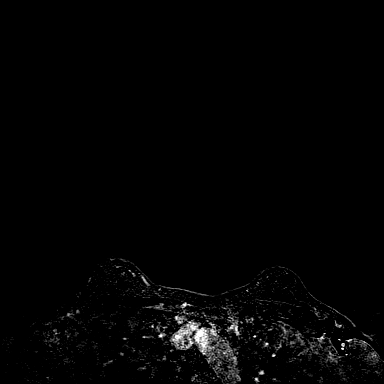
[im 144/144]
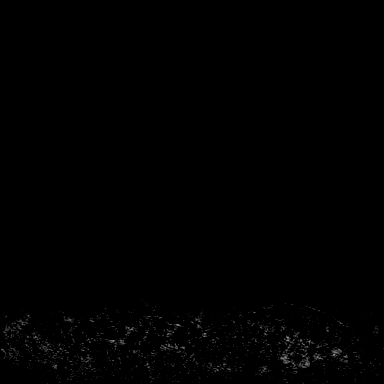

[Series 7: fl3d post-cm 20 · axial · 172.8mm · 0.83mm/px · 1 of 1 slices shown (3 of 3)]
[im 1/1]
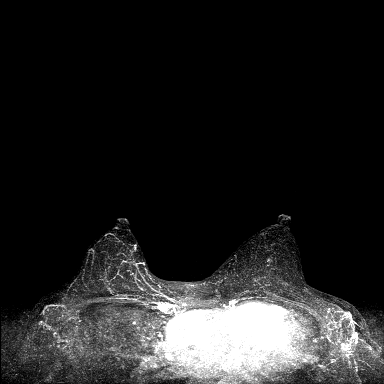

[Series 8: fl3d post-cm 3min · axial · 1.2mm · 0.83mm/px · z∈[-62,+110]mm · 5 of 144 slices shown]
[im 1/144]
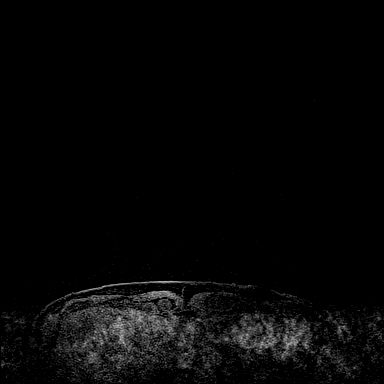
[im 36/144]
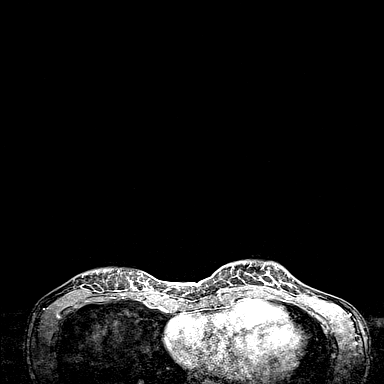
[im 72/144]
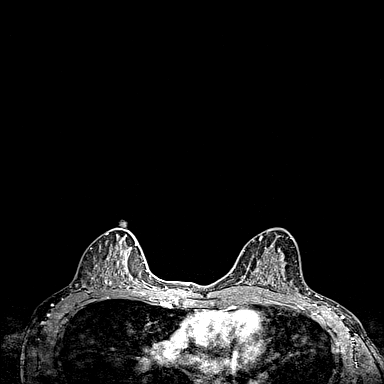
[im 108/144]
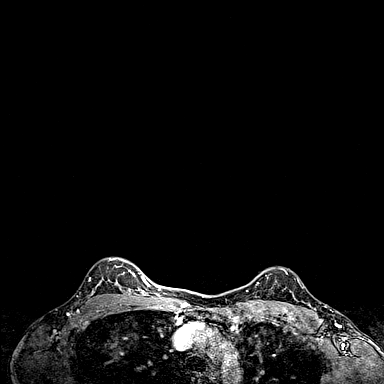
[im 144/144]
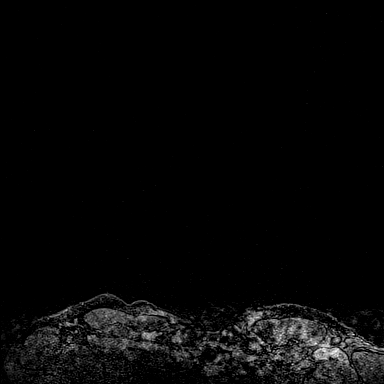

[Series 9: fl3d post-cm 3min_sub · axial · 1.2mm · 0.83mm/px · z∈[-62,+110]mm · 6 of 144 slices shown]
[im 1/144]
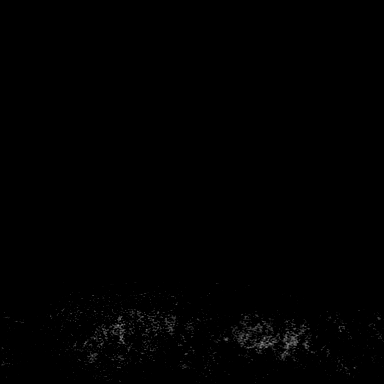
[im 29/144]
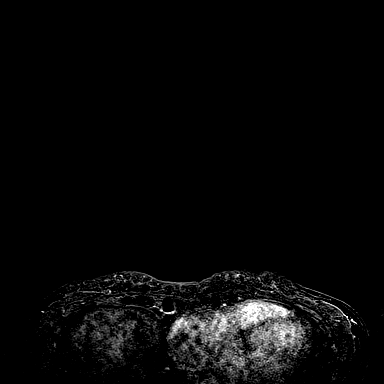
[im 58/144]
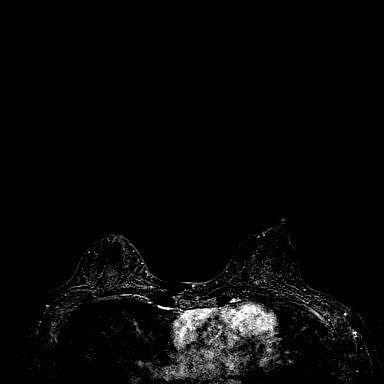
[im 86/144]
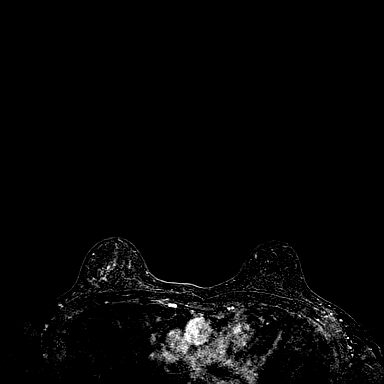
[im 115/144]
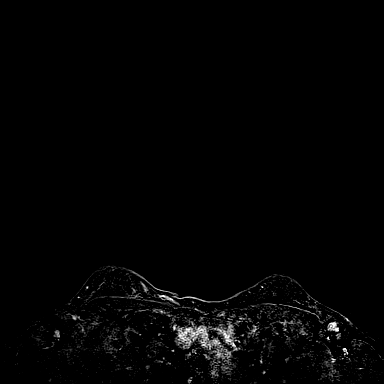
[im 144/144]
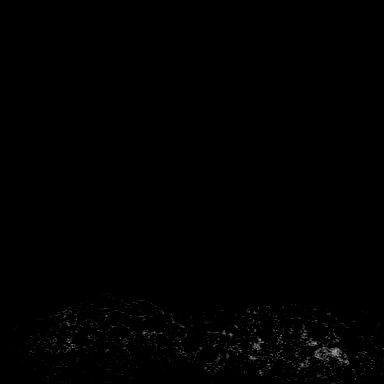

[33 of 48 positions shown; findings below may reference images not displayed]

Three-dimensional MR images were rendered by post-processing of the
original MR data on an independent workstation. The
three-dimensional MR images were interpreted, and findings are
reported in the following complete MRI report for this study. Three
dimensional images were evaluated at the independent interpreting
workstation using the DynaCAD thin client.
FINDINGS: Breast composition: c. Heterogeneous fibroglandular tissue.

Background parenchymal enhancement: Mild

Right breast: Area of linear and clumped non mass enhancement over
the upper slightly outer right breast in the middle to posterior
third (series 9, image [id number]) spanning approximately 0.8 x 2.3 cm.
No suspicious kinetics. Remainder of the right breast is
unremarkable.

Left breast: Oval 5 mm enhancing mass over the middle third of the
lower central left breast (series 9, image 99) without suspicious
kinetics. Remainder of the left breast is unremarkable.

Lymph nodes: No abnormal appearing lymph nodes.

Ancillary findings:  None.
IMPRESSION: 1. Indeterminate non mass enhancement over the right upper outer
quadrant spanning 0.8 x 2.3 cm.

2. 5 mm indeterminate mass over the middle third of the lower
central left breast.

RECOMMENDATION:
Recommend MRI guided core needle biopsy of these bilateral
indeterminate abnormalities.

BI-RADS CATEGORY  4: Suspicious.

## 2020-03-01 MED ORDER — GADOBUTROL 1 MMOL/ML IV SOLN
6.0000 mL | Freq: Once | INTRAVENOUS | Status: AC | PRN
  Administered 2020-03-01: 6 mL via INTRAVENOUS

## 2020-03-01 NOTE — Telephone Encounter (Signed)
Phone call with patient.   Results of the MRI discussed.   Please assist with proceeding forward with scheduling MRI guided biopsies of the breast.

## 2020-03-01 NOTE — Telephone Encounter (Signed)
Phone call to patient.  I left a message that I called.  No details given.  I requested she return my call.   Her MRI of the breast is showing indeterminate masses of the bilateral breasts.    Biopsies are recommended.

## 2020-03-04 NOTE — Telephone Encounter (Signed)
Spoke with pt. Pt states spoke with Dr Edward Jolly on Friday with results below and verbalized understanding of results and scheduling for breast biopsy at [facility]. Pt aware will get call from [facility] to schedule and once scheduled we will precert the orders. Pt verbalized understanding.   Routing to Dr Edward Jolly for update  Staff message sent to Olegario Messier to review orders  Cc: Mayme Genta for precert  Cc: Noreene Larsson, RN for [employer] Hold Encounter closed  Patton Salles, MD  [facility] Triage Pool Results to patient through personal phone call and My Chart.  Please place in mammogram hold.  See phone note.   Hi Nada,   We just discussed your results by phone.   My office will contact you next week to help in scheduling the MRI guided breast biopsies.   I hope you have a good weekend.   Conley Simmonds, MD

## 2020-03-04 NOTE — Telephone Encounter (Signed)
Patient placed in [facility] hold.  

## 2020-03-04 NOTE — Telephone Encounter (Signed)
Alyssa Livingston Clinical Pool Hi got your call. Assuming something is wrong. The phones were already off when I called back

## 2020-03-05 NOTE — Telephone Encounter (Signed)
Pt has breast bx scheduled for 03/07/20 at [facility].  Routing to Dr Edward Jolly for update and review Encounter closed

## 2020-03-07 ENCOUNTER — Ambulatory Visit
Admission: RE | Admit: 2020-03-07 | Discharge: 2020-03-07 | Disposition: A | Discharge status: Home / Self Care | Payer: 59 | Source: Ambulatory Visit | Attending: Obstetrics and Gynecology | Admitting: Obstetrics and Gynecology

## 2020-03-07 ENCOUNTER — Other Ambulatory Visit (HOSPITAL_COMMUNITY): Payer: Self-pay | Admitting: Diagnostic Radiology

## 2020-03-07 ENCOUNTER — Other Ambulatory Visit: Payer: Self-pay

## 2020-03-07 DIAGNOSIS — R928 Other abnormal and inconclusive findings on diagnostic imaging of breast: Secondary | ICD-10-CM

## 2020-03-07 DIAGNOSIS — Z9189 Other specified personal risk factors, not elsewhere classified: Secondary | ICD-10-CM

## 2020-03-07 DIAGNOSIS — N6323 Unspecified lump in the left breast, lower outer quadrant: Secondary | ICD-10-CM | POA: Diagnosis not present

## 2020-03-07 DIAGNOSIS — N6011 Diffuse cystic mastopathy of right breast: Secondary | ICD-10-CM | POA: Diagnosis not present

## 2020-03-07 DIAGNOSIS — N62 Hypertrophy of breast: Secondary | ICD-10-CM | POA: Diagnosis not present

## 2020-03-07 IMAGING — MG MM BREAST LOCALIZATION CLIP
4 series · 4 of 12 positions shown · non-contrast
Comparison: Previous exam(s).

CLINICAL DATA: Confirmation of clip placement after MRI guided
biopsy of non-mass enhancement involving the UPPER OUTER QUADRANT of
the RIGHT breast and a 5 mm mass involving the LOWER OUTER QUADRANT
of the LEFT breast.

EXAM:
2D AND TOMOSYNTHESIS DIAGNOSTIC BILATERAL MAMMOGRAM POST MRI BIOPSY

[L CC synth-2D]
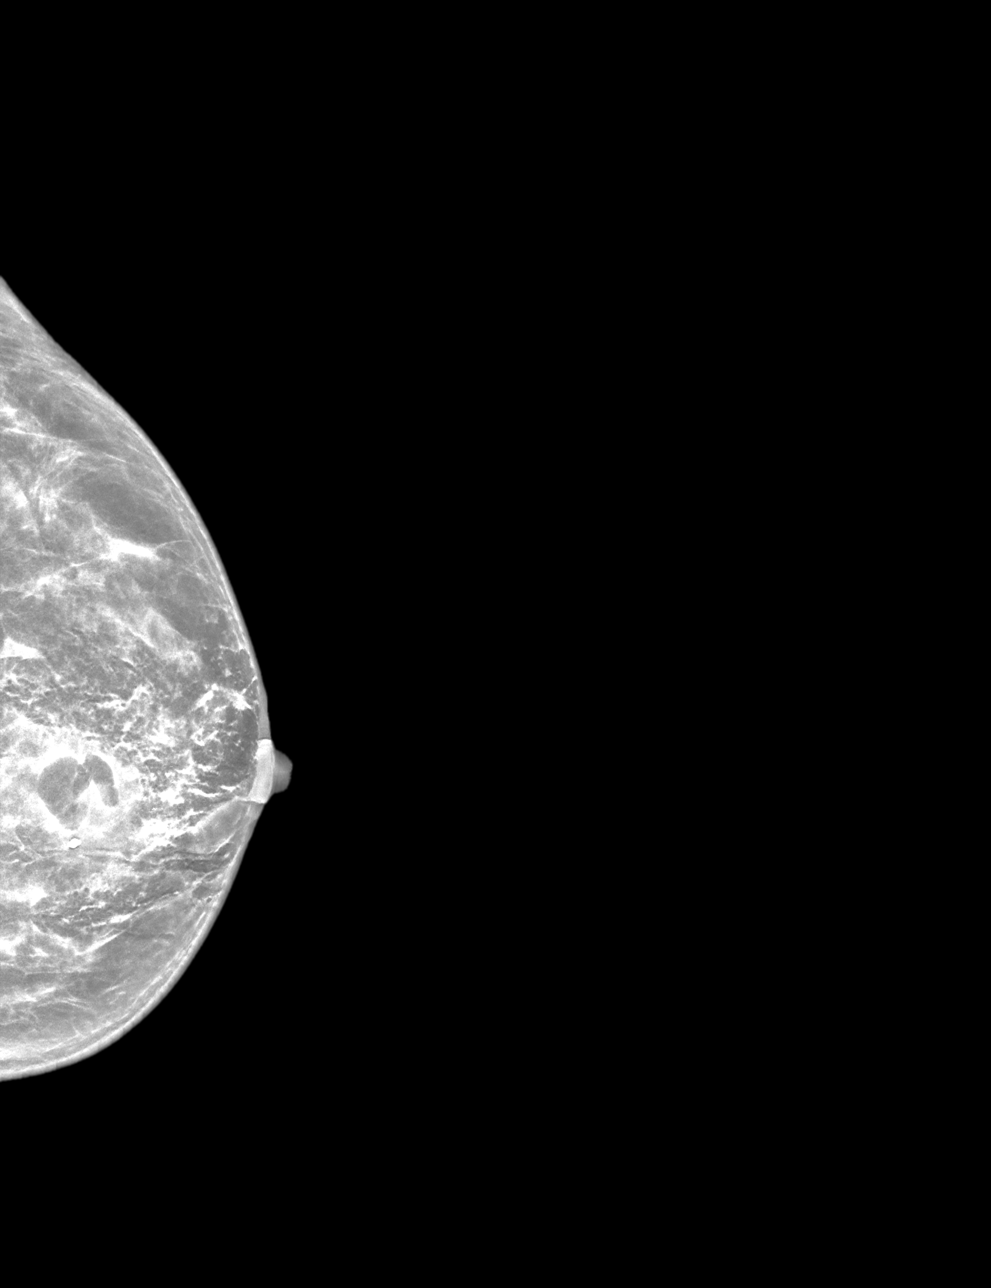

[L ML synth-2D]
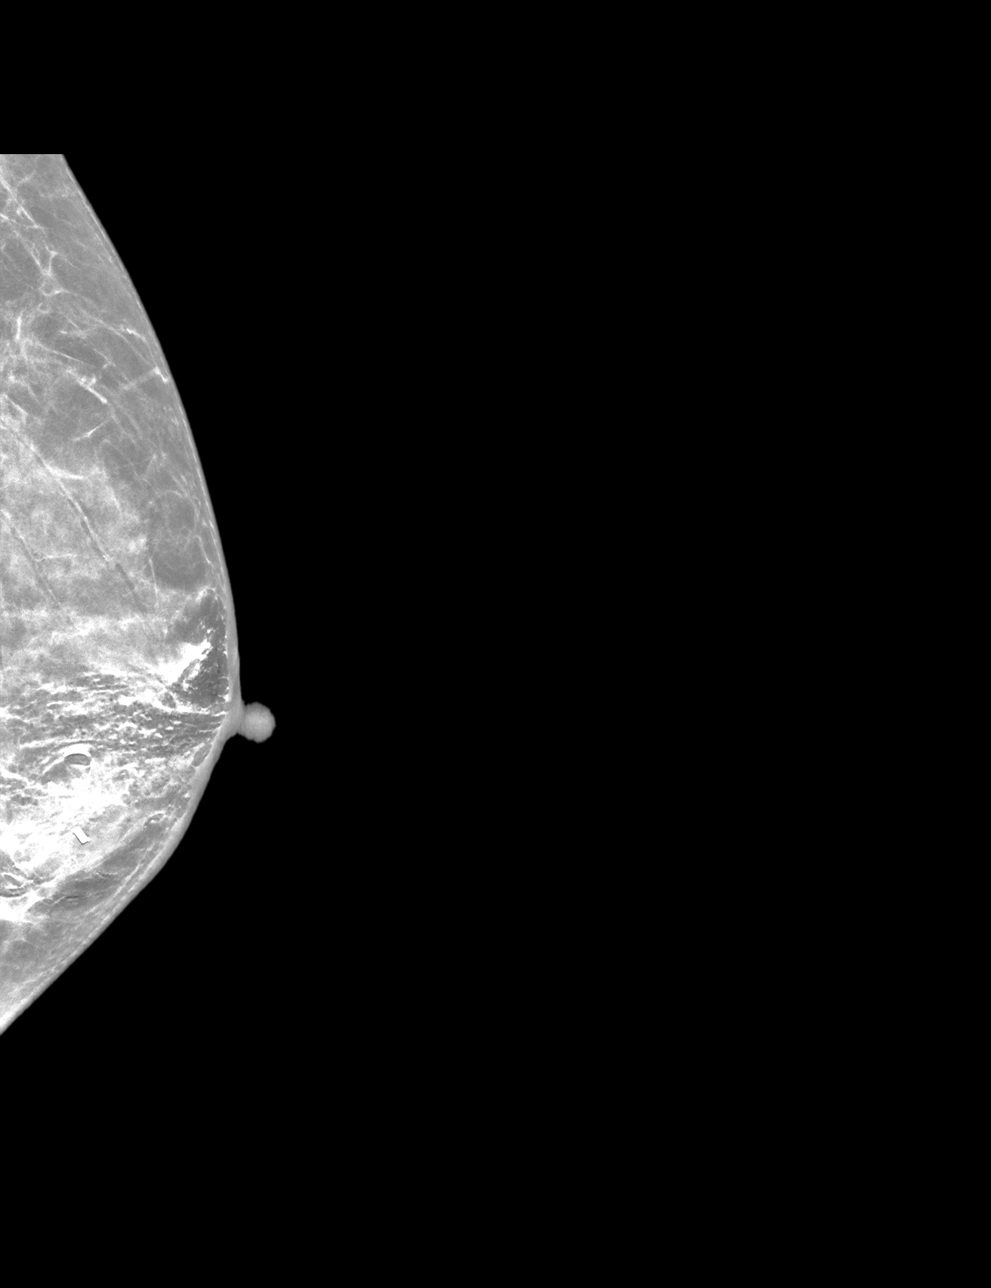

[L ML tomo · tomo slice 23/45.0]
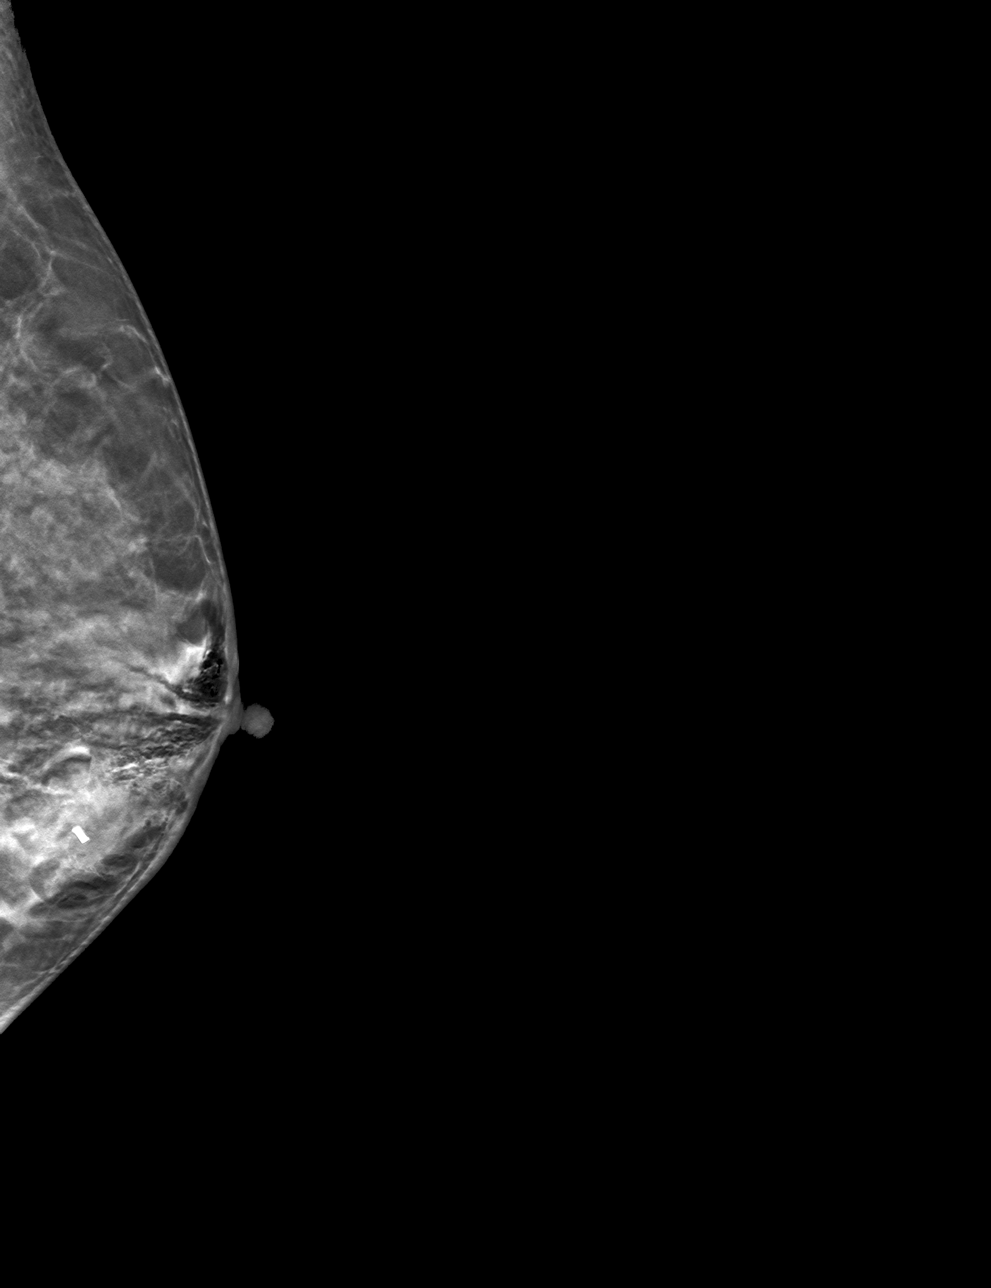

[L CC tomo · tomo slice 25/48.0]
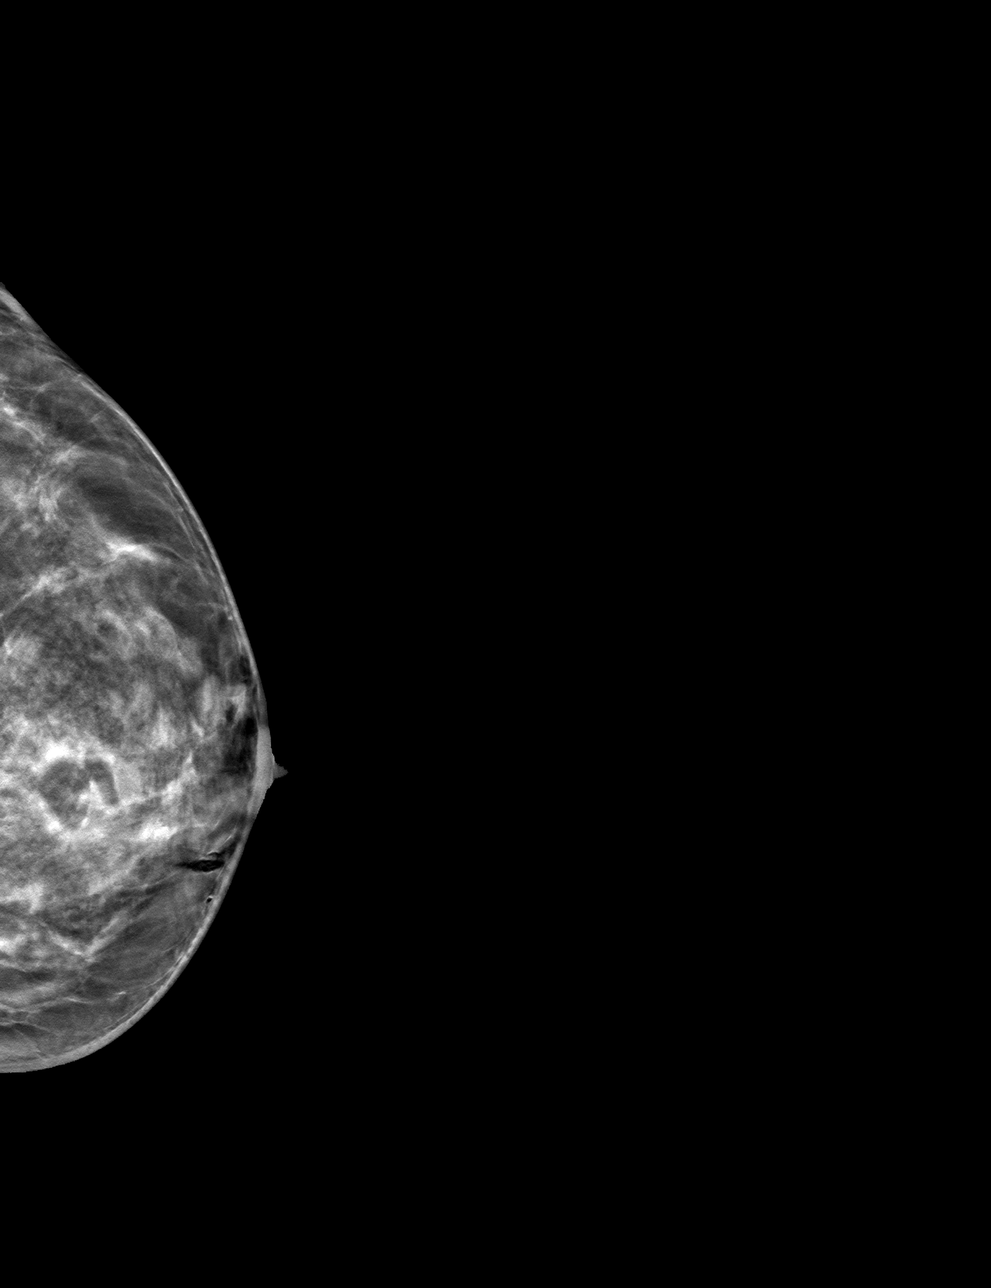

[4 of 12 positions shown; findings below may reference images not displayed]

FINDINGS: Tomosynthesis and synthesized full field CC and mediolateral images
of both breasts were obtained following MRI guided biopsy of
non-mass enhancement involving the UPPER OUTER QUADRANT of the RIGHT
breast and a mass involving the LOWER OUTER QUADRANT of the LEFT
breast.

RIGHT: The cylinder shaped tissue marking clip is in the expected
position at the site of the biopsied non-mass enhancement in the
UPPER OUTER QUADRANT at MIDDLE depth.

LEFT: The cylinder shaped tissue marking clip migrated approximately
1.5 cm medially in the biopsy cavity relative to its location in the
slight LOWER OUTER QUADRANT at ANTERIOR depth.

Expected post biopsy changes are present at both sites without
evidence of hematoma. There is residual lavage fluid within the
biopsy cavities in both breasts.
IMPRESSION: 1. Appropriate positioning of the cylinder shaped biopsy marking
clip at the site of the biopsied non-mass enhancement in the UPPER
OUTER QUADRANT of the RIGHT breast at MIDDLE depth.
2. Approximate 1.5 cm MEDIAL migration of the cylinder shaped tissue
marker clip relative to the biopsied mass in the slight LOWER OUTER
QUADRANT of the LEFT breast at ANTERIOR depth.

Final Assessment: Post Procedure Mammograms for Marker Placement

## 2020-03-07 IMAGING — MG MM BREAST LOCALIZATION CLIP
4 series · 4 of 12 positions shown · non-contrast
Comparison: Previous exam(s).

CLINICAL DATA: Confirmation of clip placement after MRI guided
biopsy of non-mass enhancement involving the UPPER OUTER QUADRANT of
the RIGHT breast and a 5 mm mass involving the LOWER OUTER QUADRANT
of the LEFT breast.

EXAM:
2D AND TOMOSYNTHESIS DIAGNOSTIC BILATERAL MAMMOGRAM POST MRI BIOPSY

[R ML synth-2D]
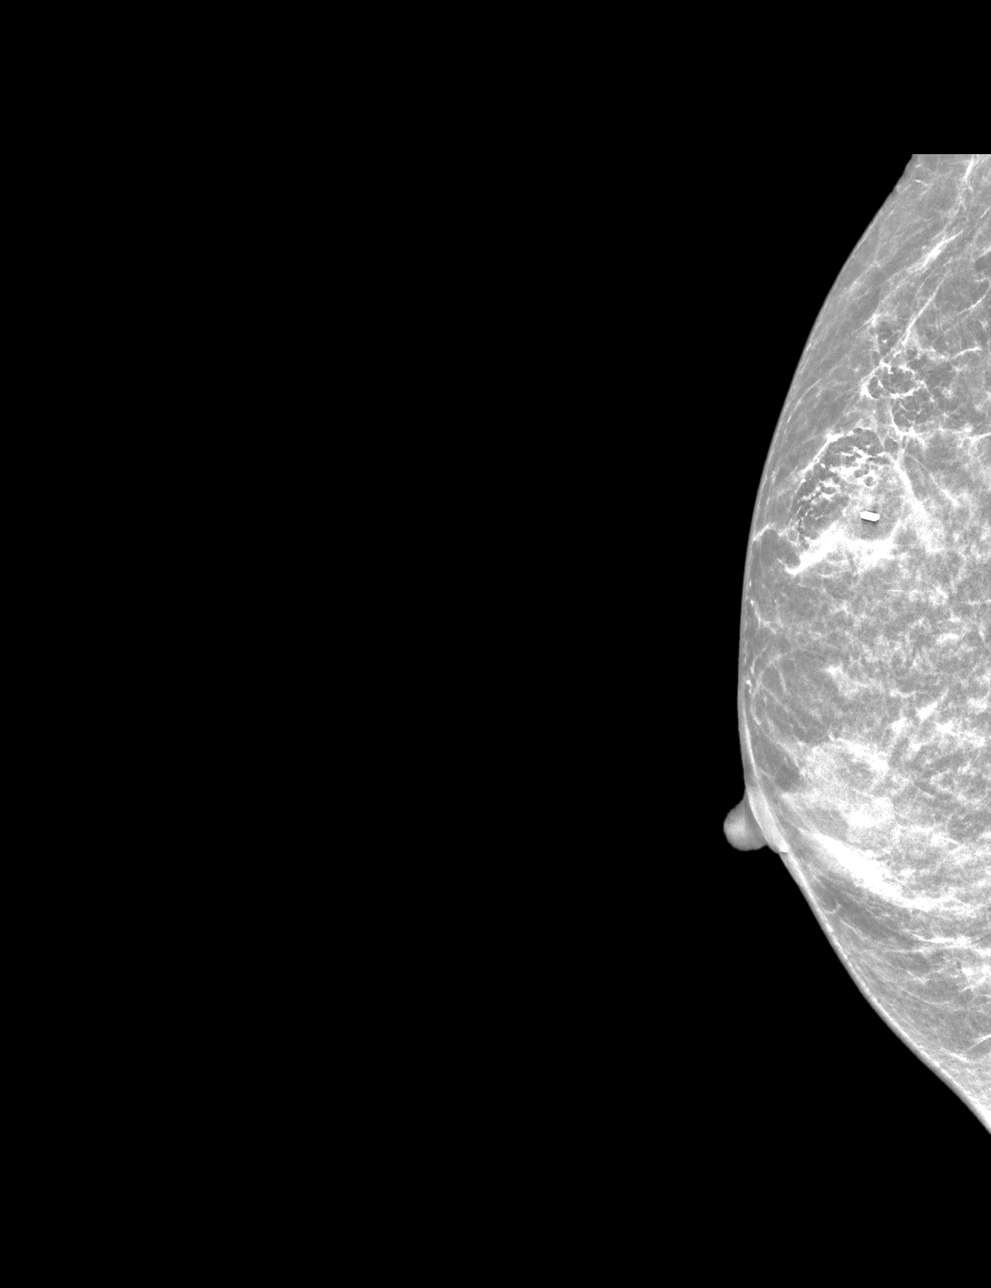

[R CC synth-2D]
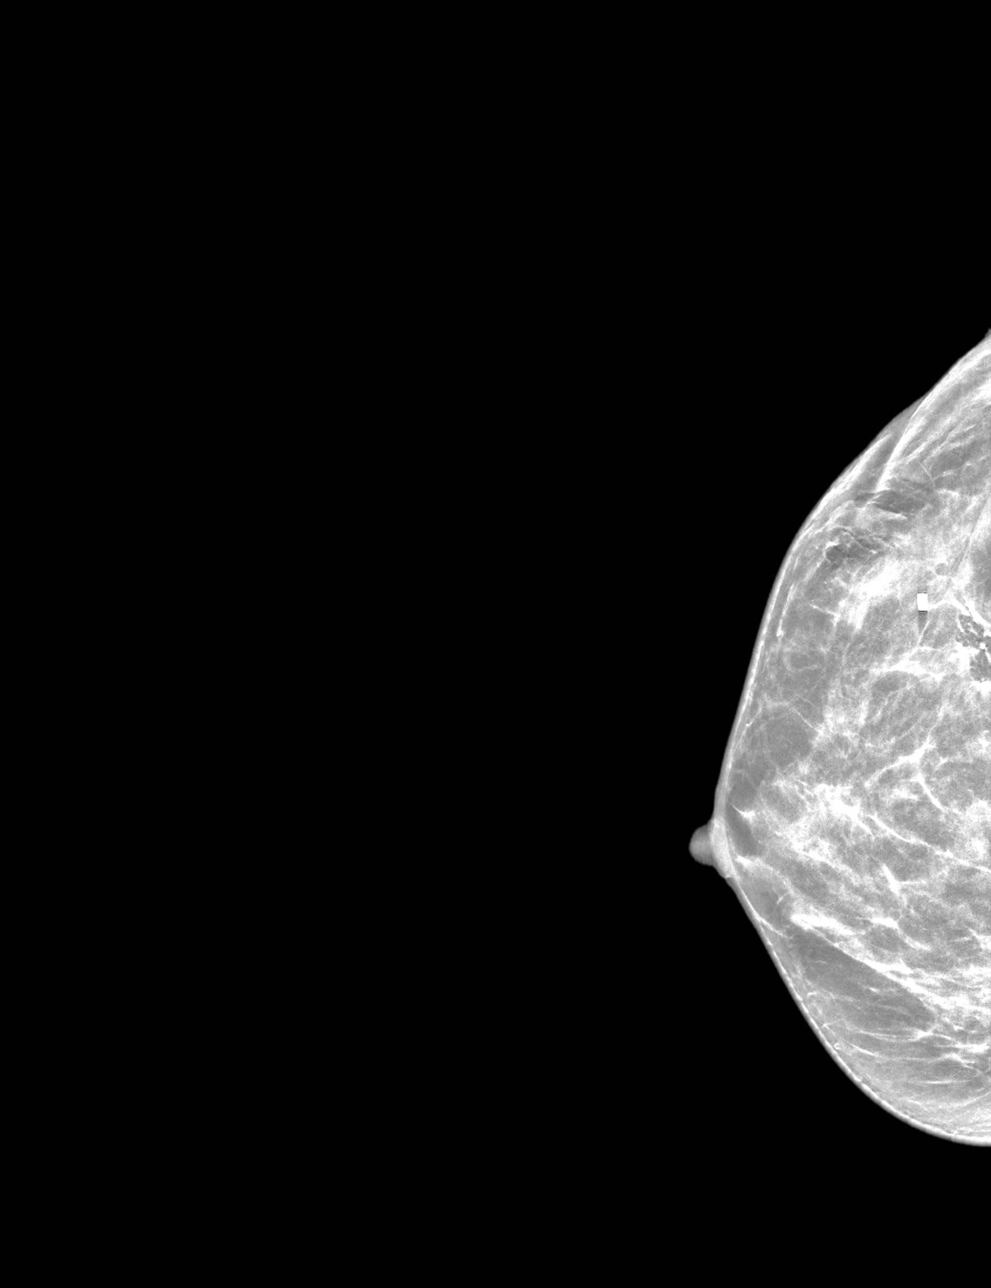

[R ML tomo · tomo slice 21/40.0]
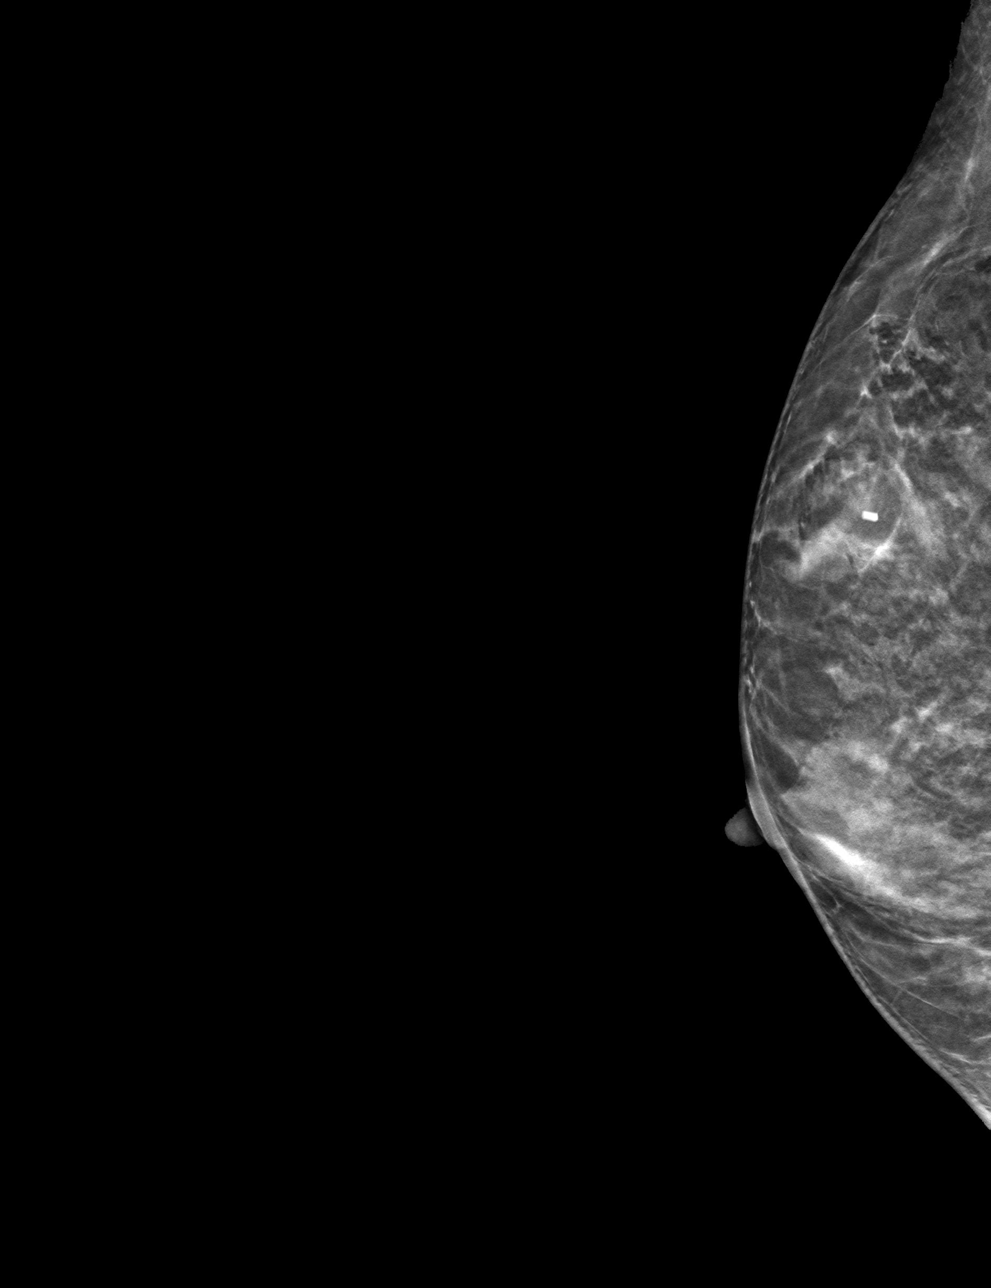

[R CC tomo · tomo slice 23/46.0]
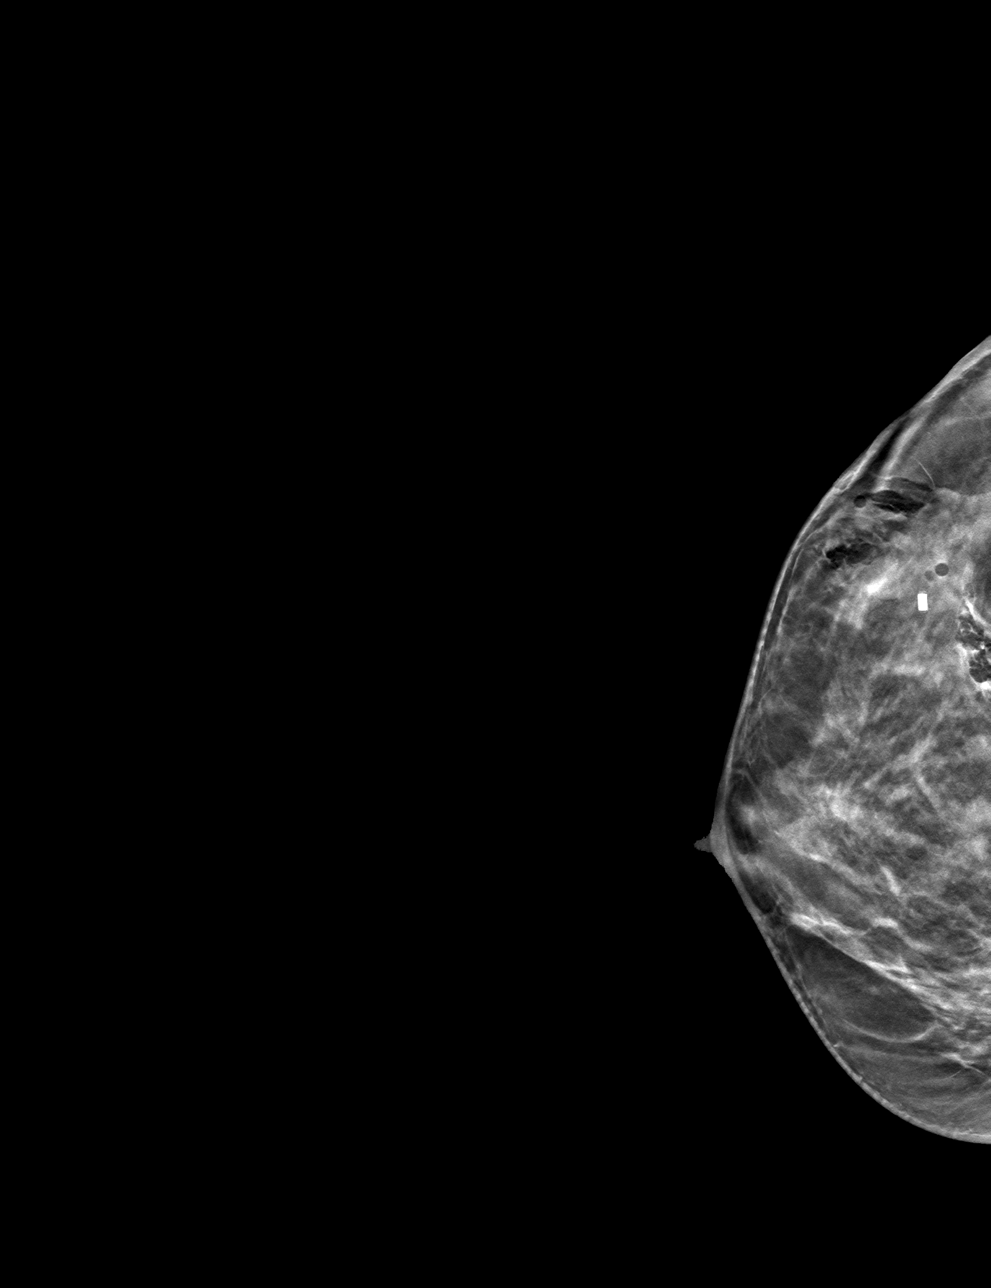

[4 of 12 positions shown; findings below may reference images not displayed]

FINDINGS: Tomosynthesis and synthesized full field CC and mediolateral images
of both breasts were obtained following MRI guided biopsy of
non-mass enhancement involving the UPPER OUTER QUADRANT of the RIGHT
breast and a mass involving the LOWER OUTER QUADRANT of the LEFT
breast.

RIGHT: The cylinder shaped tissue marking clip is in the expected
position at the site of the biopsied non-mass enhancement in the
UPPER OUTER QUADRANT at MIDDLE depth.

LEFT: The cylinder shaped tissue marking clip migrated approximately
1.5 cm medially in the biopsy cavity relative to its location in the
slight LOWER OUTER QUADRANT at ANTERIOR depth.

Expected post biopsy changes are present at both sites without
evidence of hematoma. There is residual lavage fluid within the
biopsy cavities in both breasts.
IMPRESSION: 1. Appropriate positioning of the cylinder shaped biopsy marking
clip at the site of the biopsied non-mass enhancement in the UPPER
OUTER QUADRANT of the RIGHT breast at MIDDLE depth.
2. Approximate 1.5 cm MEDIAL migration of the cylinder shaped tissue
marker clip relative to the biopsied mass in the slight LOWER OUTER
QUADRANT of the LEFT breast at ANTERIOR depth.

Final Assessment: Post Procedure Mammograms for Marker Placement

## 2020-03-07 IMAGING — MR MR BREAST BX W/ LOC DEV 1ST LEASION IMAGE BX SPEC MR GUIDE*R*
7 of 10 series · 30 of 48 positions shown · IV contrast (6 ML GADAVIST)
Comparison: Previous exams.
COMPARISON: Previous exams.

Addendum:
CLINICAL DATA: 48-year-old with an elevated lifetime risk of breast
cancer estimated at 24.5%, family history of breast cancer in mother
at age 48, with a recent high risk screening MRI demonstrating a
cm span of non-mass enhancement in the UPPER OUTER QUADRANT of the
RIGHT breast at MIDDLE to POSTERIOR depth and a 5 mm mass in the
slight LOWER OUTER QUADRANT of the LEFT breast.

EXAM:
MRI GUIDED CORE NEEDLE BIOPSY OF THE RIGHT BREAST
MRI GUIDED CORE BIOPSY OF THE LEFT BREAST
TECHNIQUE: Multiplanar, multisequence MR imaging of both breasts was performed
both before and after administration of intravenous contrast.
CONTRAST:  6 mL Gadavist IV.

[Series 2: fiducial bilateral · sagittal · 2.0mm · 1.33mm/px · 4 of 144 slices shown]
[im 1/144]
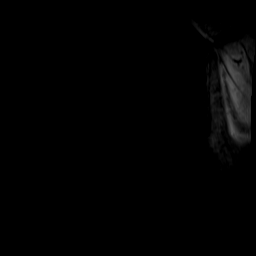
[im 48/144]
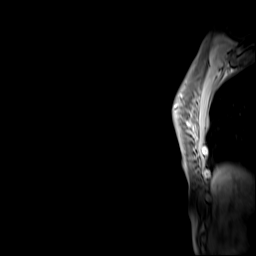
[im 96/144]
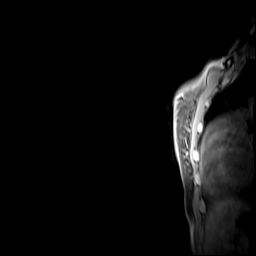
[im 144/144]
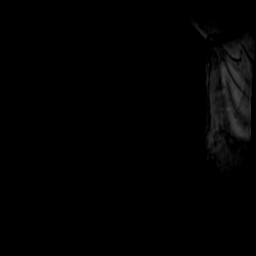

[Series 3: dynamic pre · axial · non-contrast · 1.3mm · 0.73mm/px · z∈[-40,+146]mm · 4 of 144 slices shown]
[im 1/144]
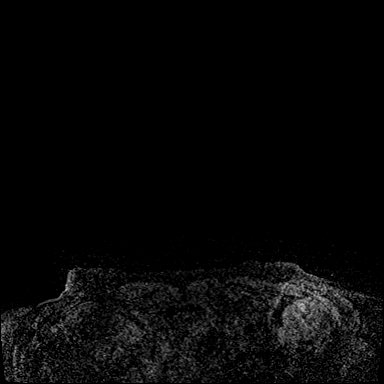
[im 48/144]
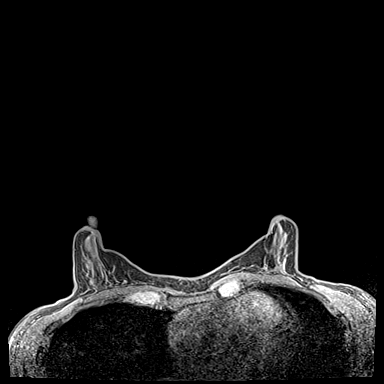
[im 96/144]
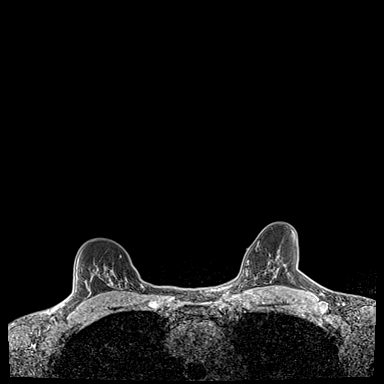
[im 144/144]
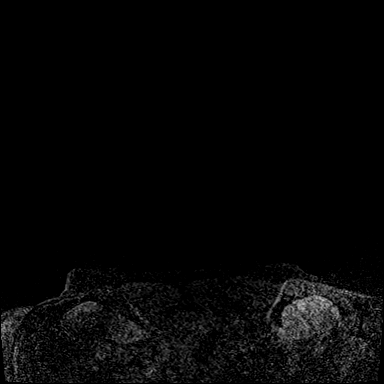

[Series 4: dynamic post 20 · axial · 1.3mm · 0.73mm/px · z∈[-40,+146]mm · 5 of 144 slices shown (1 of 2)]
[im 1/144]
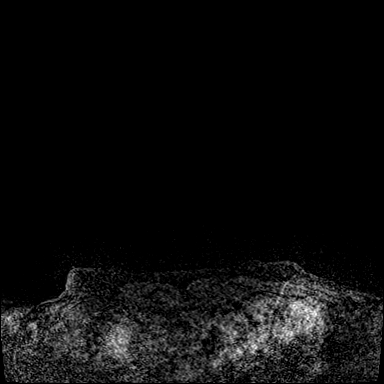
[im 36/144]
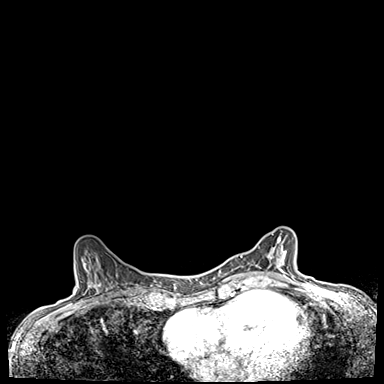
[im 72/144]
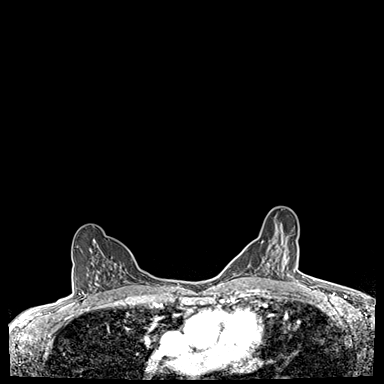
[im 108/144]
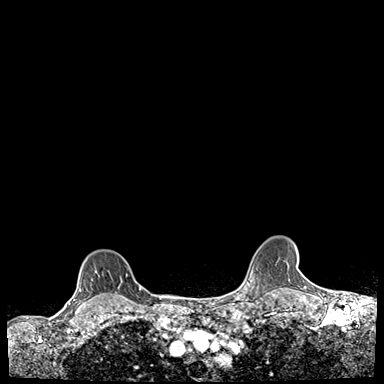
[im 144/144]
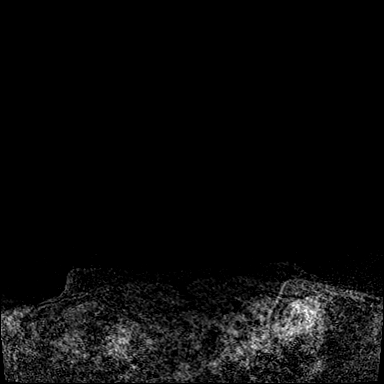

[Series 5: dynamic post 20 · axial · 1.3mm · 0.73mm/px · z∈[-40,+146]mm · 5 of 144 slices shown (2 of 2)]
[im 1/144]
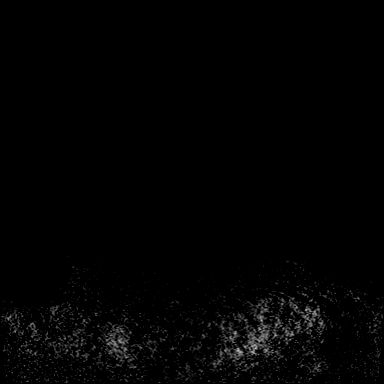
[im 36/144]
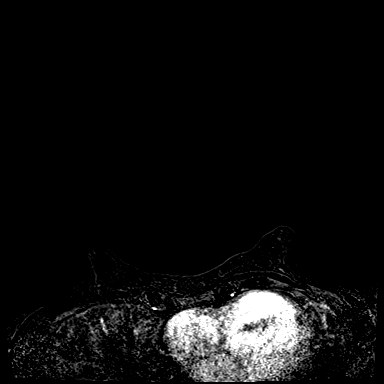
[im 72/144]
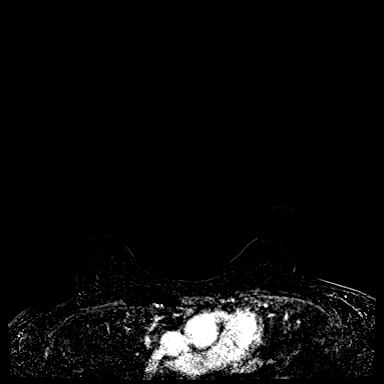
[im 108/144]
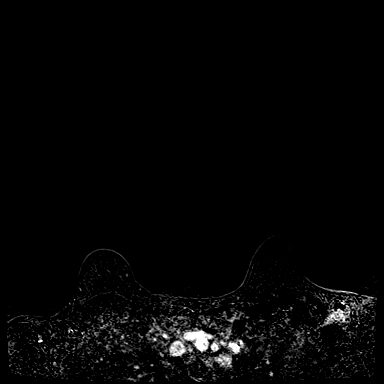
[im 144/144]
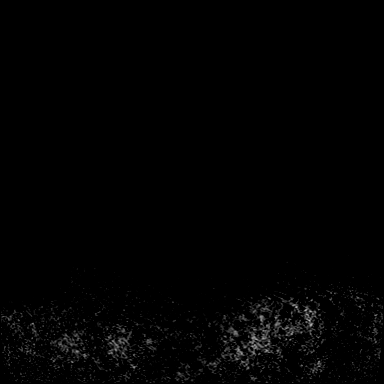

[Series 6: dynamic post 3 · axial · 1.3mm · 0.73mm/px · z∈[-40,+146]mm · 5 of 144 slices shown (1 of 2)]
[im 1/144]
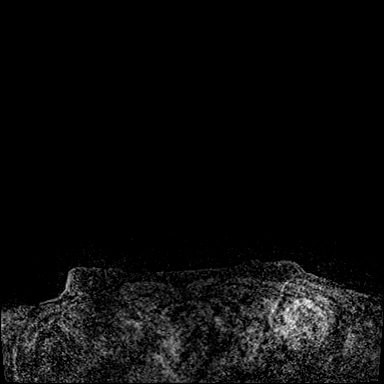
[im 36/144]
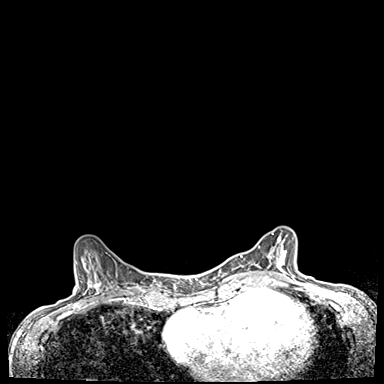
[im 72/144]
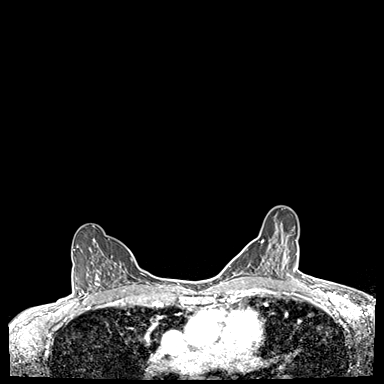
[im 108/144]
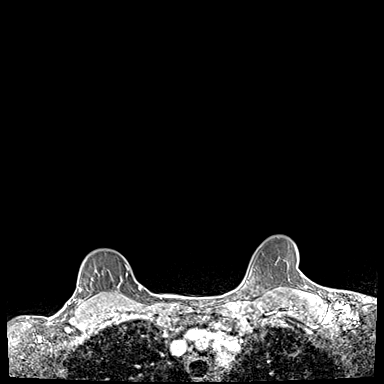
[im 144/144]
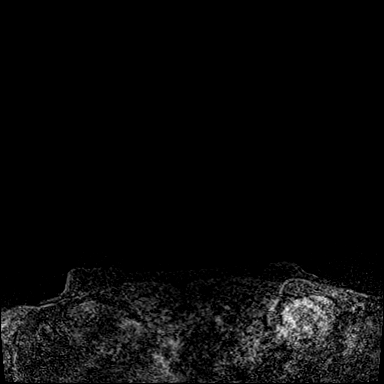

[Series 7: dynamic post 3 · axial · 1.3mm · 0.73mm/px · z∈[-40,+146]mm · 5 of 144 slices shown (2 of 2)]
[im 1/144]
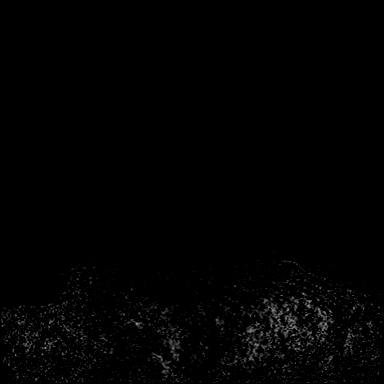
[im 36/144]
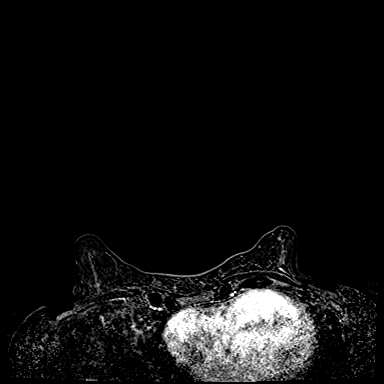
[im 72/144]
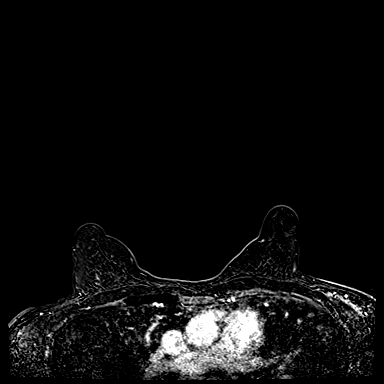
[im 108/144]
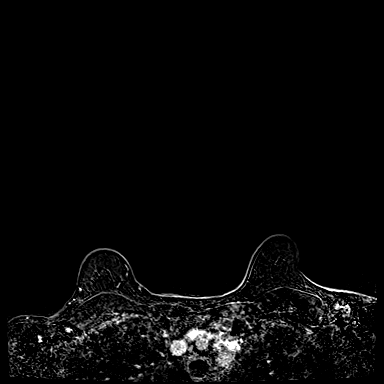
[im 144/144]
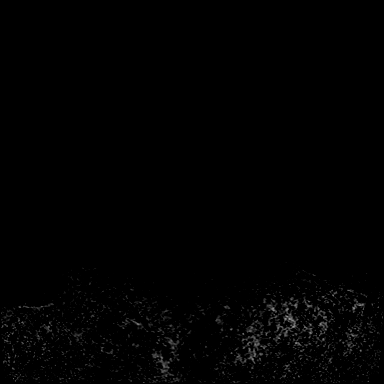

[Series 8: needle confirmation · axial · 1.3mm · 0.73mm/px · z∈[-40,+5]mm · 2 of 144 slices shown]
[im 1/144]
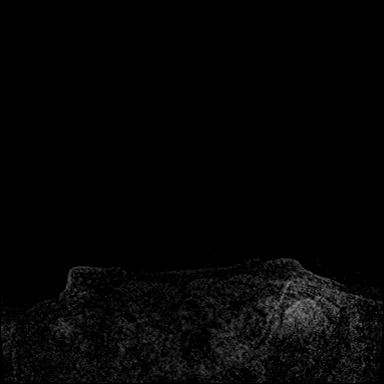
[im 36/144]
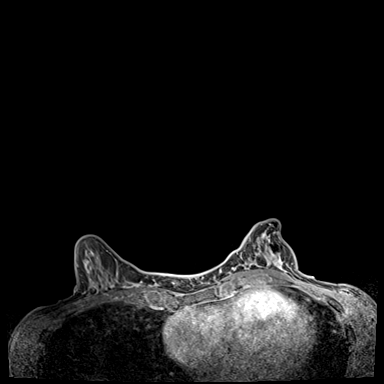

[30 of 48 positions shown; findings below may reference images not displayed]

FINDINGS: I met with the patient, and we discussed the procedure of MRI guided
biopsy, including risks, benefits, and alternatives. Specifically,
we discussed the risks of infection, bleeding, tissue injury, clip
migration, and inadequate sampling. Informed, written consent was
given. The usual time out protocol was performed immediately prior
to the procedure.

# 1) RIGHT breast: Lesion quadrant: UPPER OUTER QUADRANT.

Using sterile technique with chlorhexidine as skin antisepsis, 1%
lidocaine and 1% lidocaine with epinephrine as local anesthetic,
using MRI guidance, a 9 gauge Eviva vacuum assisted device was used
to perform biopsy of the non-mass enhancement in the UPPER OUTER
QUADRANT using a lateral approach. At the conclusion of the
procedure, a cylinder shaped tissue marker clip was deployed into
the biopsy cavity.

# 2) LEFT breast: Lesion quadrant: Slight LOWER OUTER QUADRANT.

Using sterile technique with chlorhexidine as skin antisepsis, 1%
lidocaine and 1% lidocaine with epinephrine as local anesthetic,
using MRI guidance, a 9 gauge Eviva vacuum assisted device was used
perform biopsy of the mass in the slight LOWER OUTER QUADRANT using
a lateral approach. At the conclusion of the procedure, a cylinder
shaped tissue marker clip was deployed into the biopsy cavity.

Follow-up 2-view mammogram was performed and dictated separately.
IMPRESSION: 1. MRI guided biopsy of non-mass enhancement involving the UPPER
OUTER QUADRANT of the RIGHT breast.
2. MRI guided biopsy of a 5 mm mass involving the slight LOWER OUTER
QUADRANT of the LEFT breast.
3. No apparent immediate complications.

ADDENDUM:
Pathology revealed FIBROCYSTIC CHANGE of the RIGHT breast, upper
outer quadrant middle to posterior depth. This was found to be
concordant by Dr. NECHAEV.

Pathology revealed COMPLEX SCLEROSING LESION WITH USUAL DUCT
EPITHELIAL HYPERPLASIA of the LEFT breast, lower outer quadrant
middle depth. This was found to be concordant by Dr. NECHAEV,
with a recommendation for surgical consultation to discuss excision.

Pathology results were discussed with the patient by telephone. The
patient reported doing well after the biopsies with tenderness at
the sites, with bruising on the LEFT. Post biopsy instructions and
care were reviewed and questions were answered. The patient was
encouraged to call The [REDACTED] for any
additional concerns. My direct phone number was provided.

Surgical consultation has been arranged with Dr. NECHAEV
at [REDACTED] on [DATE].

The patient was instructed to return for bilateral mammography and
supplemental high risk bilateral breast MRI in 1 year.

Pathology results reported by NECHAEV, RN on [DATE].

*** End of Addendum ***
FINDINGS: I met with the patient, and we discussed the procedure of MRI guided
biopsy, including risks, benefits, and alternatives. Specifically,
we discussed the risks of infection, bleeding, tissue injury, clip
migration, and inadequate sampling. Informed, written consent was
given. The usual time out protocol was performed immediately prior
to the procedure.

# 1) RIGHT breast: Lesion quadrant: UPPER OUTER QUADRANT.

Using sterile technique with chlorhexidine as skin antisepsis, 1%
lidocaine and 1% lidocaine with epinephrine as local anesthetic,
using MRI guidance, a 9 gauge Eviva vacuum assisted device was used
to perform biopsy of the non-mass enhancement in the UPPER OUTER
QUADRANT using a lateral approach. At the conclusion of the
procedure, a cylinder shaped tissue marker clip was deployed into
the biopsy cavity.

# 2) LEFT breast: Lesion quadrant: Slight LOWER OUTER QUADRANT.

Using sterile technique with chlorhexidine as skin antisepsis, 1%
lidocaine and 1% lidocaine with epinephrine as local anesthetic,
using MRI guidance, a 9 gauge Eviva vacuum assisted device was used
perform biopsy of the mass in the slight LOWER OUTER QUADRANT using
a lateral approach. At the conclusion of the procedure, a cylinder
shaped tissue marker clip was deployed into the biopsy cavity.

Follow-up 2-view mammogram was performed and dictated separately.
IMPRESSION: 1. MRI guided biopsy of non-mass enhancement involving the UPPER
OUTER QUADRANT of the RIGHT breast.
2. MRI guided biopsy of a 5 mm mass involving the slight LOWER OUTER
QUADRANT of the LEFT breast.
3. No apparent immediate complications.

## 2020-03-07 MED ORDER — GADOBUTROL 1 MMOL/ML IV SOLN
6.0000 mL | Freq: Once | INTRAVENOUS | Status: AC | PRN
  Administered 2020-03-07: 6 mL via INTRAVENOUS

## 2020-03-16 DIAGNOSIS — R748 Abnormal levels of other serum enzymes: Secondary | ICD-10-CM

## 2020-03-16 HISTORY — DX: Abnormal levels of other serum enzymes: R74.8

## 2020-03-26 MED FILL — LO LOESTRIN FE 1-10 TABLET: 1 MG-10 MCG | 28 days supply | Qty: 28 | Fill #5

## 2020-03-28 ENCOUNTER — Other Ambulatory Visit (HOSPITAL_COMMUNITY): Payer: Self-pay | Admitting: Optometrist

## 2020-03-28 MED FILL — BEPREVE 1.5% EYE DROPS: 1.5 | 25 days supply | Qty: 5 | Fill #0

## 2020-04-17 MED FILL — LO LOESTRIN FE 1-10 TABLET: 1 MG-10 MCG | 28 days supply | Qty: 28 | Fill #6

## 2020-04-24 DIAGNOSIS — Z803 Family history of malignant neoplasm of breast: Secondary | ICD-10-CM | POA: Diagnosis not present

## 2020-04-24 DIAGNOSIS — Z9189 Other specified personal risk factors, not elsewhere classified: Secondary | ICD-10-CM | POA: Diagnosis not present

## 2020-04-24 DIAGNOSIS — N6320 Unspecified lump in the left breast, unspecified quadrant: Secondary | ICD-10-CM | POA: Diagnosis not present

## 2020-04-25 ENCOUNTER — Other Ambulatory Visit: Payer: Self-pay | Admitting: General Surgery

## 2020-04-25 DIAGNOSIS — N6489 Other specified disorders of breast: Secondary | ICD-10-CM

## 2020-04-26 ENCOUNTER — Other Ambulatory Visit: Payer: Self-pay | Admitting: General Surgery

## 2020-04-26 DIAGNOSIS — N6489 Other specified disorders of breast: Secondary | ICD-10-CM

## 2020-05-14 ENCOUNTER — Encounter (HOSPITAL_BASED_OUTPATIENT_CLINIC_OR_DEPARTMENT_OTHER): Payer: Self-pay | Admitting: General Surgery

## 2020-05-14 ENCOUNTER — Other Ambulatory Visit: Payer: Self-pay

## 2020-05-16 MED FILL — LO LOESTRIN FE 1-10 TABLET: 1 MG-10 MCG | 28 days supply | Qty: 28 | Fill #7

## 2020-05-17 ENCOUNTER — Inpatient Hospital Stay (HOSPITAL_COMMUNITY): Admission: RE | Admit: 2020-05-17 | Payer: 59 | Source: Ambulatory Visit

## 2020-05-18 ENCOUNTER — Other Ambulatory Visit (HOSPITAL_COMMUNITY)
Admission: RE | Admit: 2020-05-18 | Discharge: 2020-05-18 | Disposition: A | Discharge status: Home / Self Care | Payer: 59 | Source: Ambulatory Visit | Attending: General Surgery | Admitting: General Surgery

## 2020-05-18 DIAGNOSIS — Z01812 Encounter for preprocedural laboratory examination: Secondary | ICD-10-CM | POA: Diagnosis not present

## 2020-05-18 DIAGNOSIS — Z20822 Contact with and (suspected) exposure to covid-19: Secondary | ICD-10-CM | POA: Diagnosis not present

## 2020-05-18 LAB — SARS CORONAVIRUS 2 (TAT 6-24 HRS): SARS Coronavirus 2: NEGATIVE

## 2020-05-20 ENCOUNTER — Encounter (HOSPITAL_BASED_OUTPATIENT_CLINIC_OR_DEPARTMENT_OTHER)
Admission: RE | Admit: 2020-05-20 | Discharge: 2020-05-20 | Disposition: A | Discharge status: Home / Self Care | Payer: 59 | Source: Ambulatory Visit | Attending: General Surgery | Admitting: General Surgery

## 2020-05-20 ENCOUNTER — Ambulatory Visit
Admission: RE | Admit: 2020-05-20 | Discharge: 2020-05-20 | Disposition: A | Discharge status: Home / Self Care | Payer: 59 | Source: Ambulatory Visit | Attending: General Surgery | Admitting: General Surgery

## 2020-05-20 ENCOUNTER — Other Ambulatory Visit: Payer: Self-pay

## 2020-05-20 DIAGNOSIS — Z01812 Encounter for preprocedural laboratory examination: Secondary | ICD-10-CM | POA: Insufficient documentation

## 2020-05-20 DIAGNOSIS — R928 Other abnormal and inconclusive findings on diagnostic imaging of breast: Secondary | ICD-10-CM | POA: Diagnosis not present

## 2020-05-20 DIAGNOSIS — N6489 Other specified disorders of breast: Secondary | ICD-10-CM

## 2020-05-20 LAB — POCT PREGNANCY, URINE: Preg Test, Ur: NEGATIVE

## 2020-05-20 MED ORDER — ENSURE PRE-SURGERY PO LIQD: 296.0000 mL | Freq: Once | ORAL | Status: DC

## 2020-05-20 NOTE — Progress Notes (Signed)

## 2020-05-21 ENCOUNTER — Ambulatory Visit (HOSPITAL_BASED_OUTPATIENT_CLINIC_OR_DEPARTMENT_OTHER)
Admission: RE | Admit: 2020-05-21 | Discharge: 2020-05-21 | Disposition: A | Discharge status: Home / Self Care | Payer: 59 | Attending: General Surgery | Admitting: General Surgery

## 2020-05-21 ENCOUNTER — Ambulatory Visit (HOSPITAL_BASED_OUTPATIENT_CLINIC_OR_DEPARTMENT_OTHER): Payer: 59 | Admitting: Anesthesiology

## 2020-05-21 ENCOUNTER — Encounter (HOSPITAL_BASED_OUTPATIENT_CLINIC_OR_DEPARTMENT_OTHER): Payer: Self-pay | Admitting: General Surgery

## 2020-05-21 ENCOUNTER — Encounter (HOSPITAL_BASED_OUTPATIENT_CLINIC_OR_DEPARTMENT_OTHER)
Admission: RE | Disposition: A | Discharge status: Home / Self Care | Payer: Self-pay | Source: Home / Self Care | Attending: General Surgery

## 2020-05-21 ENCOUNTER — Ambulatory Visit
Admission: RE | Admit: 2020-05-21 | Discharge: 2020-05-21 | Disposition: A | Discharge status: Home / Self Care | Payer: 59 | Source: Ambulatory Visit | Attending: General Surgery | Admitting: General Surgery

## 2020-05-21 ENCOUNTER — Other Ambulatory Visit: Payer: Self-pay

## 2020-05-21 DIAGNOSIS — N6082 Other benign mammary dysplasias of left breast: Secondary | ICD-10-CM | POA: Diagnosis not present

## 2020-05-21 DIAGNOSIS — R928 Other abnormal and inconclusive findings on diagnostic imaging of breast: Secondary | ICD-10-CM | POA: Diagnosis not present

## 2020-05-21 DIAGNOSIS — Z803 Family history of malignant neoplasm of breast: Secondary | ICD-10-CM | POA: Insufficient documentation

## 2020-05-21 DIAGNOSIS — Z8261 Family history of arthritis: Secondary | ICD-10-CM | POA: Diagnosis not present

## 2020-05-21 DIAGNOSIS — N6012 Diffuse cystic mastopathy of left breast: Secondary | ICD-10-CM | POA: Diagnosis not present

## 2020-05-21 DIAGNOSIS — N6022 Fibroadenosis of left breast: Secondary | ICD-10-CM | POA: Diagnosis not present

## 2020-05-21 DIAGNOSIS — N6320 Unspecified lump in the left breast, unspecified quadrant: Secondary | ICD-10-CM | POA: Diagnosis not present

## 2020-05-21 DIAGNOSIS — N6489 Other specified disorders of breast: Secondary | ICD-10-CM

## 2020-05-21 DIAGNOSIS — N6092 Unspecified benign mammary dysplasia of left breast: Secondary | ICD-10-CM | POA: Diagnosis not present

## 2020-05-21 DIAGNOSIS — G43909 Migraine, unspecified, not intractable, without status migrainosus: Secondary | ICD-10-CM | POA: Diagnosis not present

## 2020-05-21 HISTORY — PX: RADIOACTIVE SEED GUIDED EXCISIONAL BREAST BIOPSY: SHX6490

## 2020-05-21 IMAGING — MG MM BREAST SURGICAL SPECIMEN
2 series · 4 of 4 positions shown · non-contrast
Comparison: Previous exam(s).

CLINICAL DATA: Evaluate surgical specimen following excision of
LEFT breast complex sclerosing lesions.

EXAM:
SPECIMEN RADIOGRAPH OF THE LEFT BREAST

[Series 2: L · left · 0.07mm/px · 2 of 2 slices shown (1 of 2)]
[im 1/2]
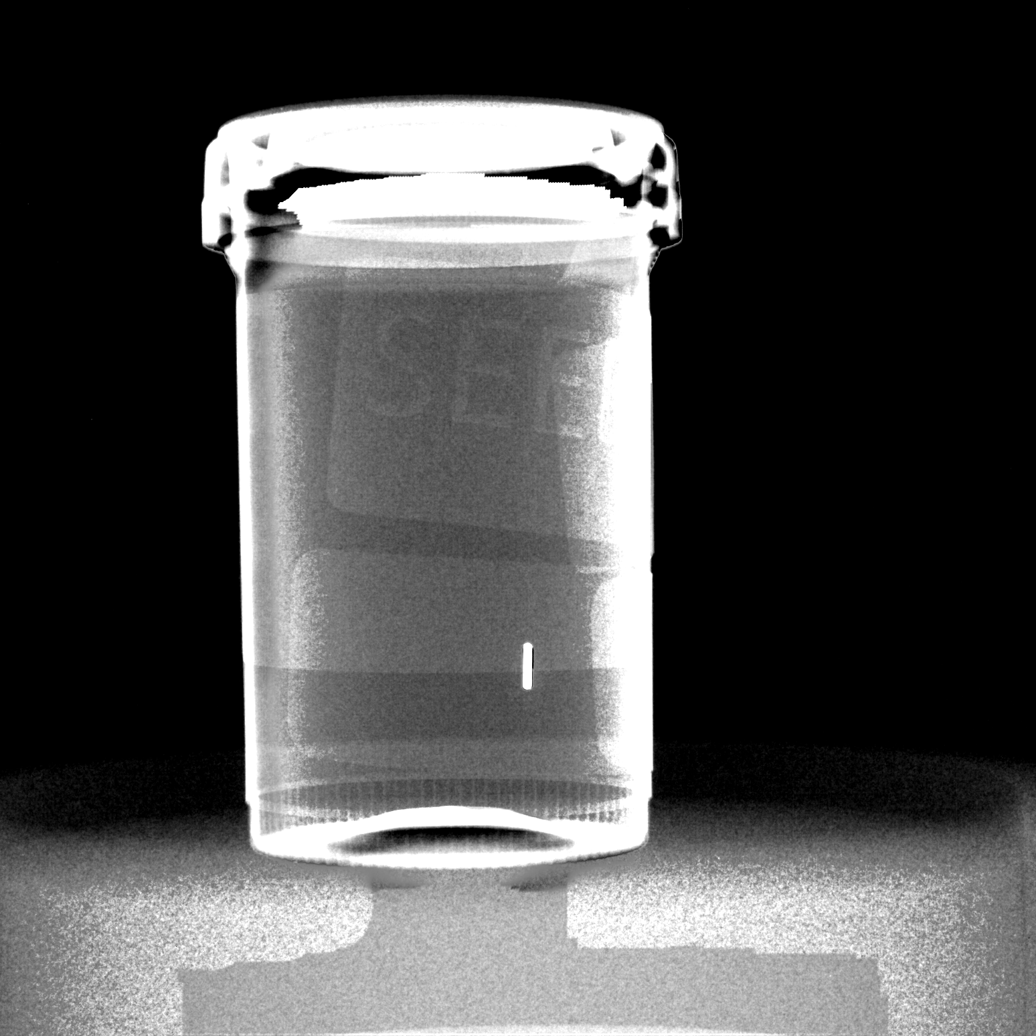
[im 2/2]
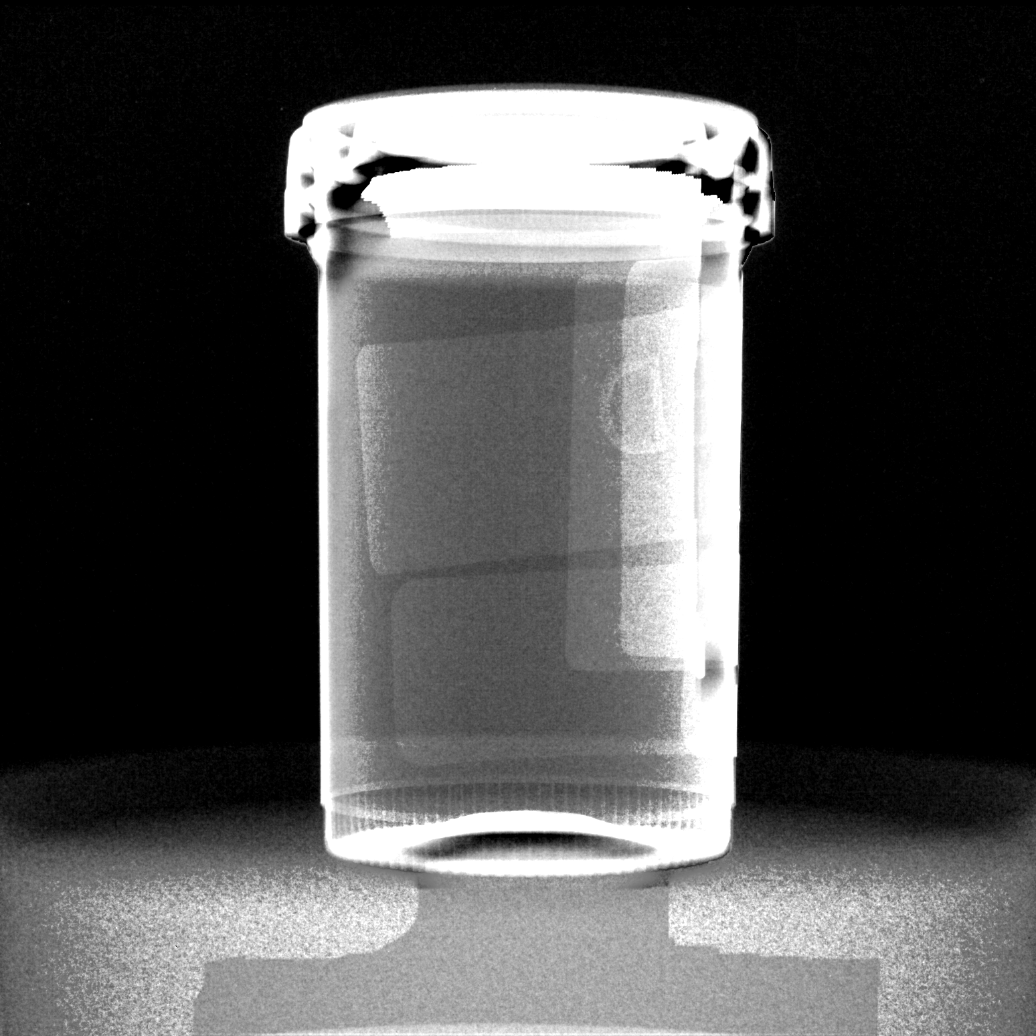

[Series 3: L · left · 0.07mm/px · 2 of 2 slices shown (2 of 2)]
[im 1/2]
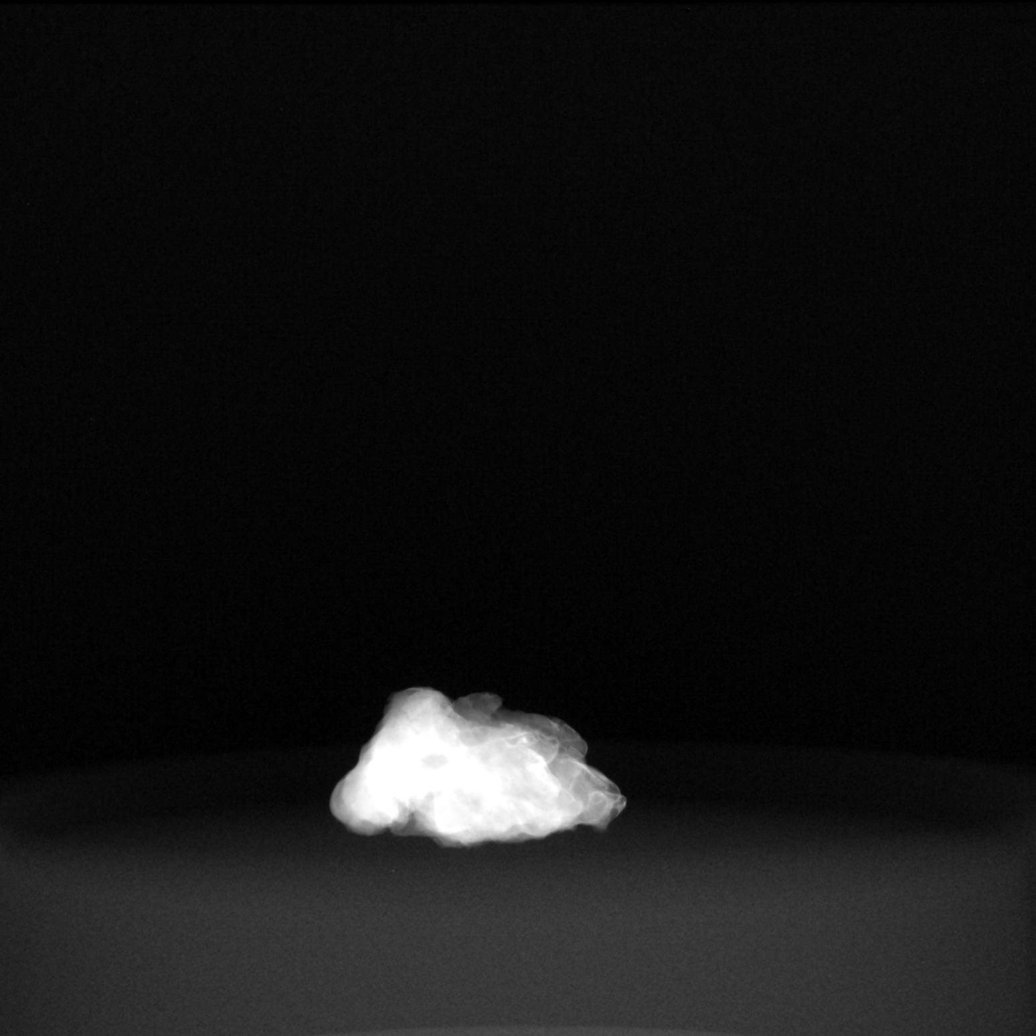
[im 2/2]
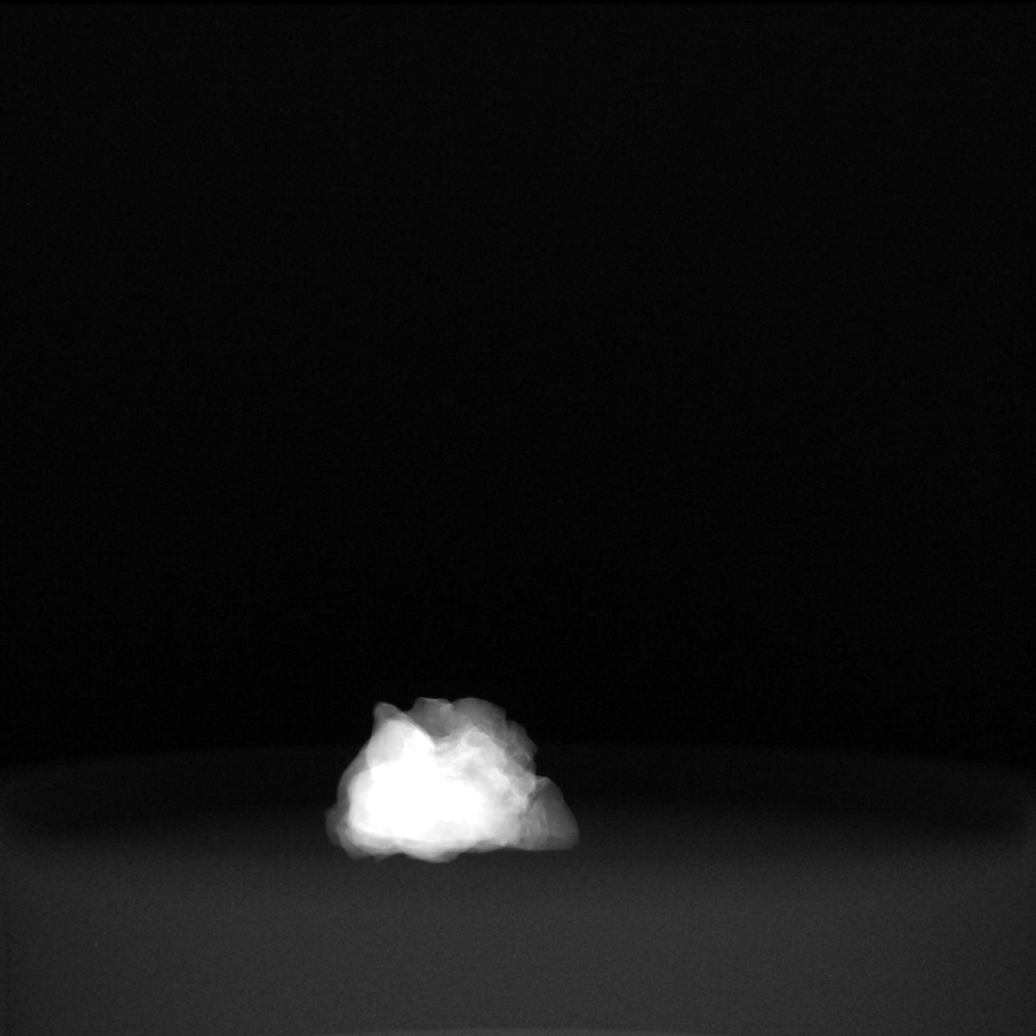

[4 of 4 positions shown; findings below may reference images not displayed]

FINDINGS: Status post excision of the LEFT breast. The CYLINDER biopsy marker
clip is present within the surgical specimen. The radioactive seed
is identified in a separate container and completely intact.
IMPRESSION: Specimen radiograph of the LEFT breast.

## 2020-05-21 SURGERY — RADIOACTIVE SEED GUIDED BREAST BIOPSY
Anesthesia: General | Site: Breast | Laterality: Left

## 2020-05-21 MED ORDER — BUPIVACAINE HCL (PF) 0.25 % IJ SOLN
INTRAMUSCULAR | Status: DC | PRN
  Administered 2020-05-21: 10 mL

## 2020-05-21 MED ORDER — LIDOCAINE 2% (20 MG/ML) 5 ML SYRINGE
INTRAMUSCULAR | Status: AC
  Filled 2020-05-21: qty 5

## 2020-05-21 MED ORDER — ONDANSETRON HCL 4 MG/2ML IJ SOLN
INTRAMUSCULAR | Status: AC
  Filled 2020-05-21: qty 2

## 2020-05-21 MED ORDER — LIDOCAINE 2% (20 MG/ML) 5 ML SYRINGE
INTRAMUSCULAR | Status: DC | PRN
  Administered 2020-05-21: 40 mg via INTRAVENOUS

## 2020-05-21 MED ORDER — PROPOFOL 10 MG/ML IV BOLUS
INTRAVENOUS | Status: DC | PRN
  Administered 2020-05-21: 150 mg via INTRAVENOUS

## 2020-05-21 MED ORDER — SCOPOLAMINE 1 MG/3DAYS TD PT72
1.0000 | MEDICATED_PATCH | TRANSDERMAL | Status: DC
  Administered 2020-05-21: 1.5 mg via TRANSDERMAL

## 2020-05-21 MED ORDER — KETOROLAC TROMETHAMINE 15 MG/ML IJ SOLN
INTRAMUSCULAR | Status: AC
  Filled 2020-05-21: qty 1

## 2020-05-21 MED ORDER — CEFAZOLIN SODIUM-DEXTROSE 2-4 GM/100ML-% IV SOLN
INTRAVENOUS | Status: AC
  Filled 2020-05-21: qty 100

## 2020-05-21 MED ORDER — FENTANYL CITRATE (PF) 100 MCG/2ML IJ SOLN
INTRAMUSCULAR | Status: DC | PRN
  Administered 2020-05-21 (×2): 50 ug via INTRAVENOUS

## 2020-05-21 MED ORDER — SCOPOLAMINE 1 MG/3DAYS TD PT72
MEDICATED_PATCH | TRANSDERMAL | Status: AC
  Filled 2020-05-21: qty 1

## 2020-05-21 MED ORDER — MIDAZOLAM HCL 5 MG/5ML IJ SOLN
INTRAMUSCULAR | Status: DC | PRN
  Administered 2020-05-21: 2 mg via INTRAVENOUS

## 2020-05-21 MED ORDER — ONDANSETRON HCL 4 MG/2ML IJ SOLN
INTRAMUSCULAR | Status: DC | PRN
  Administered 2020-05-21: 4 mg via INTRAVENOUS

## 2020-05-21 MED ORDER — ACETAMINOPHEN 500 MG PO TABS
ORAL_TABLET | ORAL | Status: AC
  Filled 2020-05-21: qty 2

## 2020-05-21 MED ORDER — ACETAMINOPHEN 500 MG PO TABS
1000.0000 mg | ORAL_TABLET | ORAL | Status: AC
  Administered 2020-05-21: 1000 mg via ORAL

## 2020-05-21 MED ORDER — CEFAZOLIN SODIUM-DEXTROSE 2-4 GM/100ML-% IV SOLN
2.0000 g | INTRAVENOUS | Status: AC
  Administered 2020-05-21: 2 g via INTRAVENOUS

## 2020-05-21 MED ORDER — MIDAZOLAM HCL 2 MG/2ML IJ SOLN
INTRAMUSCULAR | Status: AC
  Filled 2020-05-21: qty 2

## 2020-05-21 MED ORDER — DEXAMETHASONE SODIUM PHOSPHATE 4 MG/ML IJ SOLN
INTRAMUSCULAR | Status: DC | PRN
  Administered 2020-05-21: 4 mg via INTRAVENOUS

## 2020-05-21 MED ORDER — FENTANYL CITRATE (PF) 100 MCG/2ML IJ SOLN
INTRAMUSCULAR | Status: AC
  Filled 2020-05-21: qty 2

## 2020-05-21 MED ORDER — KETOROLAC TROMETHAMINE 15 MG/ML IJ SOLN
15.0000 mg | INTRAMUSCULAR | Status: AC
  Administered 2020-05-21: 15 mg via INTRAVENOUS

## 2020-05-21 MED ORDER — LACTATED RINGERS IV SOLN: INTRAVENOUS | Status: DC

## 2020-05-21 MED ORDER — FENTANYL CITRATE (PF) 100 MCG/2ML IJ SOLN: 25.0000 ug | INTRAMUSCULAR | Status: DC | PRN

## 2020-05-21 SURGICAL SUPPLY — 40 items
ADH SKN CLS APL DERMABOND .7 (GAUZE/BANDAGES/DRESSINGS) ×1
APL PRP STRL LF DISP 70% ISPRP (MISCELLANEOUS) ×1
BINDER BREAST MEDIUM (GAUZE/BANDAGES/DRESSINGS) ×1 IMPLANT
BLADE SURG 15 STRL LF DISP TIS (BLADE) ×1 IMPLANT
BLADE SURG 15 STRL SS (BLADE) ×2
CHLORAPREP W/TINT 26 (MISCELLANEOUS) ×2 IMPLANT
COVER BACK TABLE 60X90IN (DRAPES) ×2 IMPLANT
COVER MAYO STAND STRL (DRAPES) ×2 IMPLANT
COVER PROBE W GEL 5X96 (DRAPES) ×2 IMPLANT
DERMABOND ADVANCED (GAUZE/BANDAGES/DRESSINGS) ×1
DERMABOND ADVANCED .7 DNX12 (GAUZE/BANDAGES/DRESSINGS) ×1 IMPLANT
DRAPE GAMMA PROBE CRDLSS 10X38 (DRAPES) ×1 IMPLANT
DRAPE LAPAROSCOPIC ABDOMINAL (DRAPES) ×2 IMPLANT
DRAPE UTILITY XL STRL (DRAPES) ×2 IMPLANT
ELECT COATED BLADE 2.86 ST (ELECTRODE) ×2 IMPLANT
ELECT REM PT RETURN 9FT ADLT (ELECTROSURGICAL) ×2
ELECTRODE REM PT RTRN 9FT ADLT (ELECTROSURGICAL) ×1 IMPLANT
GLOVE SURG ENC MOIS LTX SZ6 (GLOVE) ×2 IMPLANT
GLOVE SURG ENC MOIS LTX SZ6.5 (GLOVE) ×1 IMPLANT
GLOVE SURG ENC MOIS LTX SZ7 (GLOVE) ×4 IMPLANT
GLOVE SURG UNDER POLY LF SZ6.5 (GLOVE) ×2 IMPLANT
GLOVE SURG UNDER POLY LF SZ7 (GLOVE) ×1 IMPLANT
GLOVE SURG UNDER POLY LF SZ7.5 (GLOVE) ×2 IMPLANT
GOWN STRL REUS W/ TWL LRG LVL3 (GOWN DISPOSABLE) ×2 IMPLANT
GOWN STRL REUS W/TWL LRG LVL3 (GOWN DISPOSABLE) ×8
KIT MARKER MARGIN INK (KITS) ×2 IMPLANT
NEEDLE HYPO 25X1 1.5 SAFETY (NEEDLE) ×2 IMPLANT
PACK BASIN DAY SURGERY FS (CUSTOM PROCEDURE TRAY) ×2 IMPLANT
PENCIL SMOKE EVACUATOR (MISCELLANEOUS) ×2 IMPLANT
SLEEVE SCD COMPRESS KNEE MED (STOCKING) ×2 IMPLANT
SPONGE LAP 4X18 RFD (DISPOSABLE) ×3 IMPLANT
STRIP CLOSURE SKIN 1/2X4 (GAUZE/BANDAGES/DRESSINGS) ×2 IMPLANT
SUT MON AB 5-0 PS2 18 (SUTURE) ×1 IMPLANT
SUT VIC AB 2-0 SH 27 (SUTURE) ×2
SUT VIC AB 2-0 SH 27XBRD (SUTURE) ×1 IMPLANT
SUT VIC AB 3-0 SH 27 (SUTURE) ×2
SUT VIC AB 3-0 SH 27X BRD (SUTURE) ×1 IMPLANT
SYR CONTROL 10ML LL (SYRINGE) ×2 IMPLANT
TOWEL GREEN STERILE FF (TOWEL DISPOSABLE) ×2 IMPLANT
TRAY FAXITRON CT DISP (TRAY / TRAY PROCEDURE) ×2 IMPLANT

## 2020-05-21 NOTE — Interval H&P Note (Signed)
History and Physical Interval Note:  05/21/2020 11:51 AM  [name]  has presented today for surgery, with the diagnosis of LEFT BREAST MASS.  The various methods of treatment have been discussed with the patient and family. After consideration of risks, benefits and other options for treatment, the patient has consented to  Procedure(s) with comments: LEFT BREAST MASS SEED GUIDED EXCISIONAL BIOPSY (Left) - START TIME OF 12:30 PM FOR 60 MINUTES IN ROOM 8 as a surgical intervention.  The patient's history has been reviewed, patient examined, no change in status, stable for surgery.  I have reviewed the patient's chart and labs.  Questions were answered to the patient's satisfaction.     Alyssa Livingston

## 2020-05-21 NOTE — Anesthesia Postprocedure Evaluation (Signed)
Anesthesia Post Note  Patient: Alyssa Livingston  Procedure(s) Performed: LEFT BREAST MASS SEED GUIDED EXCISIONAL BIOPSY (Left Breast)     Patient location during evaluation: PACU Anesthesia Type: General Level of consciousness: awake and alert Pain management: pain level controlled Vital Signs Assessment: post-procedure vital signs reviewed and stable Respiratory status: spontaneous breathing, nonlabored ventilation, respiratory function stable and patient connected to nasal cannula oxygen Cardiovascular status: blood pressure returned to baseline and stable Postop Assessment: no apparent nausea or vomiting Anesthetic complications: no   No complications documented.  Last Vitals:  Vitals:   05/21/20 1330 05/21/20 1349  BP: 113/72 136/60  Pulse: 63 64  Resp: 15 16  Temp:  36.8 C  SpO2: 100% 100%    Last Pain:  Vitals:   05/21/20 1349  TempSrc:   PainSc: 2                  [name]

## 2020-05-21 NOTE — Transfer of Care (Signed)
Immediate Anesthesia Transfer of Care Note  Patient: Alyssa Livingston  Procedure(s) Performed: LEFT BREAST MASS SEED GUIDED EXCISIONAL BIOPSY (Left Breast)  Patient Location: PACU  Anesthesia Type:General  Level of Consciousness: sedated  Airway & Oxygen Therapy: Patient Spontanous Breathing and Patient connected to face mask oxygen  Post-op Assessment: Report given to RN and Post -op Vital signs reviewed and stable  Post vital signs: Reviewed and stable  Last Vitals:  Vitals Value Taken Time  BP 102/70 05/21/20 1305  Temp    Pulse 55 05/21/20 1306  Resp 13 05/21/20 1306  SpO2 100 % 05/21/20 1306  Vitals shown include unvalidated device data.  Last Pain:  Vitals:   05/21/20 1046  TempSrc: Oral  PainSc: 0-No pain      Patients Stated Pain Goal: 5 (05/21/20 1046)  Complications: No complications documented.

## 2020-05-21 NOTE — Anesthesia Procedure Notes (Signed)
Procedure Name: LMA Insertion Date/Time: 05/21/2020 12:20 PM Performed by: Burna Cash, CRNA Pre-anesthesia Checklist: Patient identified, Emergency Drugs available, Suction available and Patient being monitored Patient Re-evaluated:Patient Re-evaluated prior to induction Oxygen Delivery Method: Circle system utilized Preoxygenation: Pre-oxygenation with 100% oxygen Induction Type: IV induction Ventilation: Mask ventilation without difficulty LMA: LMA inserted LMA Size: 4.0 Number of attempts: 1 Airway Equipment and Method: Bite block Placement Confirmation: positive ETCO2 Tube secured with: Tape Dental Injury: Teeth and Oropharynx as per pre-operative assessment

## 2020-05-21 NOTE — H&P (Signed)
49 yof PT at [facility] she has history in her mom of breast cancer (had negative genetics in past) and a mgm. she has no mass or dc. she has mm in 11/21 that shows c density breasts and had a possible right breast asymmetry. this resolved on further imaging. 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 there is a 5 mm indeterminate mass in the central left breast. 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 the left breast biopsy is csl. she is referred to discuss options   Past Surgical History Mertha Finders, CMA; 04/24/2020 3:06 PM) No pertinent past surgical history   Diagnostic Studies History Mertha Finders, CMA; 04/24/2020 3:06 PM) Colonoscopy  never Mammogram  within last year Pap Smear  1-5 years ago  Allergies (Kheana Marshall-McBride, CMA; 04/24/2020 3:07 PM) No Known Drug Allergies  [04/24/2020]: No Known Allergies  [04/24/2020]: Allergies Reconciled   Medication History (Kheana Marshall-McBride, CMA; 04/24/2020 3:07 PM) No Current Medications Medications Reconciled  Social History Zigmund Gottron Marshall-McBride, CMA; 04/24/2020 3:07 PM) Alcohol use  Occasional alcohol use. Caffeine use  Coffee. No drug use  Tobacco use  Never smoker.  Family History Mertha Finders, CMA; 04/24/2020 3:07 PM) Arthritis  Mother. Breast Cancer  Mother.  Pregnancy / Birth History Mertha Finders, CMA; 04/24/2020 3:07 PM) Age at menarche  12 years. Contraceptive History  Oral contraceptives. Gravida  2 Irregular periods  Length (months) of breastfeeding  7-12 Maternal age  47-35 Para  1  Other Problems Mertha Finders, CMA; 04/24/2020 3:07 PM) No pertinent past medical history     Review of Systems Zigmund Gottron Marshall-McBride CMA; 04/24/2020 3:07 PM) General Not Present- Appetite Loss, Chills, Fatigue, Fever, Night Sweats, Weight Gain and Weight Loss. Skin Not Present- Change in  Wart/Mole, Dryness, Hives, Jaundice, New Lesions, Non-Healing Wounds, Rash and Ulcer. HEENT Present- Seasonal Allergies and Wears glasses/contact lenses. Not Present- Earache, Hearing Loss, Hoarseness, Nose Bleed, Oral Ulcers, Ringing in the Ears, Sinus Pain, Sore Throat, Visual Disturbances and Yellow Eyes. Respiratory Not Present- Bloody sputum, Chronic Cough, Difficulty Breathing, Snoring and Wheezing. Cardiovascular Not Present- Chest Pain, Difficulty Breathing Lying Down, Leg Cramps, Palpitations, Rapid Heart Rate, Shortness of Breath and Swelling of Extremities. Gastrointestinal Not Present- Abdominal Pain, Bloating, Bloody Stool, Change in Bowel Habits, Chronic diarrhea, Constipation, Difficulty Swallowing, Excessive gas, Gets full quickly at meals, Hemorrhoids, Indigestion, Nausea, Rectal Pain and Vomiting. Female Genitourinary Not Present- Frequency, Nocturia, Painful Urination, Pelvic Pain and Urgency. Musculoskeletal Not Present- Back Pain, Joint Pain, Joint Stiffness, Muscle Pain, Muscle Weakness and Swelling of Extremities. Neurological Not Present- Decreased Memory, Fainting, Headaches, Numbness, Seizures, Tingling, Tremor, Trouble walking and Weakness. Psychiatric Not Present- Anxiety, Bipolar, Change in Sleep Pattern, Depression, Fearful and Frequent crying. Endocrine Not Present- Cold Intolerance, Excessive Hunger, Hair Changes, Heat Intolerance, Hot flashes and New Diabetes. Hematology Not Present- Blood Thinners, Easy Bruising, Excessive bleeding, Gland problems, HIV and Persistent Infections.  Vitals (Kheana Marshall-McBride CMA; 04/24/2020 3:08 PM) 04/24/2020 3:07 PM Weight: 114.5 lb Height: 61in Body Surface Area: 1.49 m Body Mass Index: 21.63 kg/m  Temp.: 17F  Pulse: 141 (Regular)  P.OX: 97% (Room air) BP: 154/84(Sitting, Left Arm, Standard)       Physical Exam Emelia Loron MD; 04/25/2020 9:34 PM) General Mental Status-Alert. Orientation-Oriented  X3.  Breast Nipples-No Discharge. Breast Lump-No Palpable Breast Mass.  Lymphatic Head & Neck  General Head & Neck Lymphatics: Bilateral - Description - Normal. Axillary  General Axillary Region: Bilateral -  Description - Normal. Note: no [place] adenopathy     Assessment & Plan Emelia Loron MD; 04/25/2020 9:42 PM) FAMILY HISTORY OF BREAST CANCER (Z80.3) Story: due to her family history she is a candidate for genetics. we discussed insurance issues. we discussed outcomes such as positive, negative and a vus. we sent salivary testing to [facility] today AT HIGH RISK FOR BREAST CANCER (Z91.89) LEFT BREAST MASS (N63.20) Story: Left breast mass seed guided excisional biopsy I think with her family history and this csl it is reasonable to excise this. we discussed seed guided excisional biopsy today

## 2020-05-21 NOTE — Op Note (Signed)
Preoperative diagnosis: left breast mammographic lesion, core biopsy with csl Postoperative diagnosis: Same as above Procedure:Leftbreast radioactive seed guided excisional biopsy Surgeon: Dr. Harden Mo Anesthesia: General Specimens:Left breast tissue marked with paint containing clip, seed separate Drains: None Complications: None Estimated blood loss: Minimal Sponge and count was correct completion Disposition to recovery stable condition  Indications:49 yof PT at [facility] she has history in her mom of breast cancer (had negative genetics in past) and a mgm. she has no mass or dc. she has mm in 11/21 that shows c density breasts and had a possible right breast asymmetry. this resolved on further imaging. 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 there is a 5 mm indeterminate mass in the central left breast. 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 the left breast biopsy is csl. she is referred to discuss options  Procedure: After informed consent was obtained the patient was taken to the operating room. She had a seed placed prior to beginning. I had these mammograms available in the operating room. She was given antibiotics. SCDs were placed. She was placed under general anesthesia without complication. She was prepped and draped in the standard sterile surgical fashion. Surgical timeout was then performed.  I located the seed in the lower pole of the breast. then made a periareolar incision to hide the scar later.Iinfiltrated Marcaine around this area first. I made incision and dissected to the seed. I then used the neoprobe to guide excision of the seed and some of the surrounding tissue. The seed was in a cavity and I removed separately.  Cavity was removed.  Mammogram confirmed removal of the seed and the clip. Hemostasis was obtained. I closed the breast tissue with 2-0 Vicryl. The skin was closed with 3-0  [place] 5-0Monocryl. GlueandSteri-Strips were applied. She tolerated this well was extubated and transferred to recovery stable.

## 2020-05-21 NOTE — Anesthesia Preprocedure Evaluation (Addendum)
Anesthesia Evaluation  Patient identified by MRN, date of birth, ID band Patient awake    Reviewed: Allergy & Precautions, NPO status , Patient's Chart, lab work & pertinent test results  Airway Mallampati: I  TM Distance: >3 FB Neck ROM: Full    Dental no notable dental hx. (+) Teeth Intact, Dental Advisory Given   Pulmonary neg pulmonary ROS,    Pulmonary exam normal breath sounds clear to auscultation       Cardiovascular negative cardio ROS Normal cardiovascular exam Rhythm:Regular Rate:Normal     Neuro/Psych  Headaches, negative psych ROS   GI/Hepatic negative GI ROS, Neg liver ROS,   Endo/Other  negative endocrine ROS  Renal/GU negative Renal ROS  negative genitourinary   Musculoskeletal negative musculoskeletal ROS (+)   Abdominal   Peds  Hematology negative hematology ROS (+)   Anesthesia Other Findings Left breast mass  Reproductive/Obstetrics                            Anesthesia Physical Anesthesia Plan  ASA: II  Anesthesia Plan: General   Post-op Pain Management:    Induction: Intravenous  PONV Risk Score and Plan: 3 and Ondansetron, Dexamethasone and Midazolam  Airway Management Planned: LMA  Additional Equipment:   Intra-op Plan:   Post-operative Plan: Extubation in OR  Informed Consent: I have reviewed the patients History and Physical, chart, labs and discussed the procedure including the risks, benefits and alternatives for the proposed anesthesia with the patient or authorized representative who has indicated his/her understanding and acceptance.     Dental advisory given  Plan Discussed with: CRNA  Anesthesia Plan Comments:         Anesthesia Quick Evaluation

## 2020-05-21 NOTE — Discharge Instructions (Signed)
Next dose of Tylenol and Ibuprofen at 5 PM.  [facility] Office Phone Number [phone]  BREAST BIOPSY/ PARTIAL MASTECTOMY: POST OP INSTRUCTIONS Take 400 mg of ibuprofen every 8 hours or 650 mg tylenol every 6 hours for next 72 hours then as needed. Use ice several times daily also. Always review your discharge instruction sheet given to you by the facility where your surgery was performed.  IF YOU HAVE DISABILITY OR FAMILY LEAVE FORMS, YOU MUST BRING THEM TO THE OFFICE FOR PROCESSING.  DO NOT GIVE THEM TO YOUR DOCTOR.  1. A prescription for pain medication may be given to you upon discharge.  Take your pain medication as prescribed, if needed.  If narcotic pain medicine is not needed, then you may take acetaminophen (Tylenol), naprosyn (Alleve) or ibuprofen (Advil) as needed. 2. Take your usually prescribed medications unless otherwise directed 3. If you need a refill on your pain medication, please contact your pharmacy.  They will contact our office to request authorization.  Prescriptions will not be filled after 5pm or on week-ends. 4. You should eat very light the first 24 hours after surgery, such as soup, crackers, pudding, etc.  Resume your normal diet the day after surgery. 5. Most patients will experience some swelling and bruising in the breast.  Ice packs and a good support bra will help.  Wear the breast binder provided or a sports bra for 72 hours day and night.  After that wear a sports bra during the day until you return to the office. Swelling and bruising can take several days to resolve.  6. It is common to experience some constipation if taking pain medication after surgery.  Increasing fluid intake and taking a stool softener will usually help or prevent this problem from occurring.  A mild laxative (Milk of Magnesia or Miralax) should be taken according to package directions if there are no bowel movements after 48 hours. 7. Unless discharge instructions  indicate otherwise, you may remove your bandages 48 hours after surgery and you may shower at that time.  You may have steri-strips (small skin tapes) in place directly over the incision.  These strips should be left on the skin for 7-10 days and will come off on their own.  If your surgeon used skin glue on the incision, you may shower in 24 hours.  The glue will flake off over the next 2-3 weeks.  Any sutures or staples will be removed at the office during your follow-up visit. 8. ACTIVITIES:  You may resume regular daily activities (gradually increasing) beginning the next day.  Wearing a good support bra or sports bra minimizes pain and swelling.  You may have sexual intercourse when it is comfortable. a. You may drive when you no longer are taking prescription pain medication, you can comfortably wear a seatbelt, and you can safely maneuver your car and apply brakes. b. RETURN TO WORK:  ______________________________________________________________________________________ 9. You should see your doctor in the office for a follow-up appointment approximately two weeks after your surgery.  Your doctor's nurse will typically make your follow-up appointment when she calls you with your pathology report.  Expect your pathology report 3-4 business days after your surgery.  You may call to check if you do not hear from [place] after three days. 10. OTHER INSTRUCTIONS: _______________________________________________________________________________________________ _____________________________________________________________________________________________________________________________________ _____________________________________________________________________________________________________________________________________ _____________________________________________________________________________________________________________________________________  WHEN TO CALL DR WAKEFIELD: 1. Fever over 101.0 2. Nausea  and/or vomiting. 3. Extreme swelling or bruising. 4. Continued bleeding from incision. 5. Increased  pain, redness, or drainage from the incision.  The clinic staff is available to answer your questions during regular business hours.  Please don't hesitate to call and ask to speak to one of the nurses for clinical concerns.  If you have a medical emergency, go to the nearest emergency room or call 911.  A surgeon from [facility] is always on call at the hospital.  For further questions, please visit [email or web address]   Post Anesthesia Home Care Instructions  Activity: Get plenty of rest for the remainder of the day. A responsible individual must stay with you for 24 hours following the procedure.  For the next 24 hours, DO NOT: -Drive a car -Advertising copywriter -Drink alcoholic beverages -Take any medication unless instructed by your physician -Make any legal decisions or sign important papers.  Meals: Start with liquid foods such as gelatin or soup. Progress to regular foods as tolerated. Avoid greasy, spicy, heavy foods. If nausea and/or vomiting occur, drink only clear liquids until the nausea and/or vomiting subsides. Call your physician if vomiting continues.  Special Instructions/Symptoms: Your throat may feel dry or sore from the anesthesia or the breathing tube placed in your throat during surgery. If this causes discomfort, gargle with warm salt water. The discomfort should disappear within 24 hours.  If you had a scopolamine patch placed behind your ear for the management of post- operative nausea and/or vomiting:  1. The medication in the patch is effective for 72 hours, after which it should be removed.  Wrap patch in a tissue and discard in the trash. Wash hands thoroughly with soap and water. 2. You may remove the patch earlier than 72 hours if you experience unpleasant side effects which may include dry mouth, dizziness or visual disturbances. 3.  Avoid touching the patch. Wash your hands with soap and water after contact with the patch.

## 2020-05-22 LAB — SURGICAL PATHOLOGY

## 2020-05-23 ENCOUNTER — Encounter (HOSPITAL_BASED_OUTPATIENT_CLINIC_OR_DEPARTMENT_OTHER): Payer: Self-pay | Admitting: General Surgery

## 2020-06-11 ENCOUNTER — Encounter: Payer: Self-pay | Admitting: Obstetrics and Gynecology

## 2020-06-11 MED FILL — LO LOESTRIN FE 1-10 TABLET: 1 MG-10 MCG | 28 days supply | Qty: 28 | Fill #8

## 2020-06-12 MED FILL — BEPREVE 1.5% EYE DROPS: 1.5 | 25 days supply | Qty: 5 | Fill #1

## 2020-07-07 MED FILL — Norethin-Eth Estradiol-Fe Tab 1 MG-10 MCG (24)/10 MCG (2): ORAL | 28 days supply | Qty: 28 | Fill #0 | Status: AC

## 2020-07-16 ENCOUNTER — Other Ambulatory Visit (HOSPITAL_COMMUNITY): Payer: Self-pay

## 2020-07-18 ENCOUNTER — Other Ambulatory Visit (HOSPITAL_COMMUNITY): Payer: Self-pay

## 2020-08-13 ENCOUNTER — Other Ambulatory Visit (HOSPITAL_COMMUNITY): Payer: Self-pay

## 2020-08-13 MED FILL — Norethin-Eth Estradiol-Fe Tab 1 MG-10 MCG (24)/10 MCG (2): ORAL | 28 days supply | Qty: 28 | Fill #1 | Status: AC

## 2020-08-15 ENCOUNTER — Other Ambulatory Visit: Payer: Self-pay | Admitting: Obstetrics and Gynecology

## 2020-08-15 ENCOUNTER — Other Ambulatory Visit (HOSPITAL_COMMUNITY): Payer: Self-pay

## 2020-08-15 ENCOUNTER — Encounter: Payer: Self-pay | Admitting: Obstetrics and Gynecology

## 2020-08-15 DIAGNOSIS — Z9189 Other specified personal risk factors, not elsewhere classified: Secondary | ICD-10-CM

## 2020-08-15 DIAGNOSIS — Z1231 Encounter for screening mammogram for malignant neoplasm of breast: Secondary | ICD-10-CM

## 2020-08-15 NOTE — Telephone Encounter (Signed)
Last screening MRI 03/01/2020 Last screening Mammo 02/13/2020  On 03/12/2020 Radiologist wrote on Diag MRI from 03/07/20: The patient was instructed to return for bilateral mammography and supplemental high risk bilateral breast MRI in 1 year.  Pathology results reported by Rene Kocher, RN on 03/12/2020.  Electronically Signed   By: Hulan Saas M.D.   On: 03/12/2020 12:59

## 2020-08-16 NOTE — Telephone Encounter (Signed)
Her mammogram and breast MRI are done yearly.  See imaging report in Epic 03/07/20.  After her mammogram is done in November, this can be followed by the breast MRI in December.   Please let me know if her breast surgeon has recommended otherwise.

## 2020-08-27 NOTE — Telephone Encounter (Signed)
Please check with Dr. Earmon [place] office at the [facility] to see if it is appropriate to do the breast MRI at 6 months instead of one year.

## 2020-08-28 NOTE — Telephone Encounter (Signed)
Dr.Silva Marisue Ivan (Dr.Gudena nurse) called back and said Dr.Gudena normally recommends breast mammograms every 6 months and MRI every 6 months. Marisue Ivan said Dr.Gudena would be fine with patient scheduling Mri. I will place the order and let the patient know she can call to schedule.

## 2020-08-28 NOTE — Telephone Encounter (Signed)
Left a message for Dr.Gudena nurse to call with answer to the below note.

## 2020-08-29 ENCOUNTER — Telehealth: Payer: Self-pay

## 2020-08-29 NOTE — Telephone Encounter (Signed)
Encounter not needed

## 2020-08-29 NOTE — Telephone Encounter (Signed)
Ok to schedule breast MRI with and without contrast.

## 2020-09-04 ENCOUNTER — Other Ambulatory Visit (HOSPITAL_COMMUNITY): Payer: Self-pay

## 2020-09-04 MED FILL — Bepotastine Besilate Ophth Soln 1.5%: OPHTHALMIC | 25 days supply | Qty: 5 | Fill #0 | Status: AC

## 2020-09-04 MED FILL — Bepotastine Besilate Ophth Soln 1.5%: OPHTHALMIC | 25 days supply | Qty: 5 | Fill #0 | Status: CN

## 2020-09-04 MED FILL — Norethin-Eth Estradiol-Fe Tab 1 MG-10 MCG (24)/10 MCG (2): ORAL | 28 days supply | Qty: 28 | Fill #2 | Status: AC

## 2020-09-09 ENCOUNTER — Other Ambulatory Visit (HOSPITAL_COMMUNITY): Payer: Self-pay

## 2020-10-23 ENCOUNTER — Other Ambulatory Visit: Payer: Self-pay

## 2020-10-23 ENCOUNTER — Ambulatory Visit
Admission: RE | Admit: 2020-10-23 | Discharge: 2020-10-23 | Disposition: A | Discharge status: Home / Self Care | Payer: 59 | Source: Ambulatory Visit | Attending: Obstetrics and Gynecology | Admitting: Obstetrics and Gynecology

## 2020-10-23 DIAGNOSIS — Z9189 Other specified personal risk factors, not elsewhere classified: Secondary | ICD-10-CM

## 2020-10-23 DIAGNOSIS — N6323 Unspecified lump in the left breast, lower outer quadrant: Secondary | ICD-10-CM | POA: Diagnosis not present

## 2020-10-23 IMAGING — MR MR BREAST BILAT WO/W CM
8 of 12 series · 32 of 48 positions shown · IV contrast (5 ml gadvist)
Comparison: Bilateral screening mammogram dated [DATE], right
diagnostic mammogram dated [DATE] and bilateral breast MRI dated
[DATE].

CLINICAL DATA: Family history of breast cancer with her mother
diagnosed at age 43 and maternal grandmother diagnosed at age 87.
Status post surgical excision of a lower outer quadrant left breast
complex sclerosing lesion on [DATE]. This was seen on a
bilateral breast MRI dated [DATE]. A right breast area of non
mass enhancement seen on that MRI was also subsequently biopsied
with benign results of fibrocystic change.

LABS:  None obtained on site today.
EXAM:
BILATERAL BREAST MRI WITH AND WITHOUT CONTRAST
TECHNIQUE: Multiplanar, multisequence MR images of both breasts were obtained
prior to and following the intravenous administration of 5 ml of
Gadavist

[Series 2: t2_tirm_tra ipat (a-p) · axial · 3.0mm · 0.66mm/px · 1 of 55 slices shown]
[im 1/55]
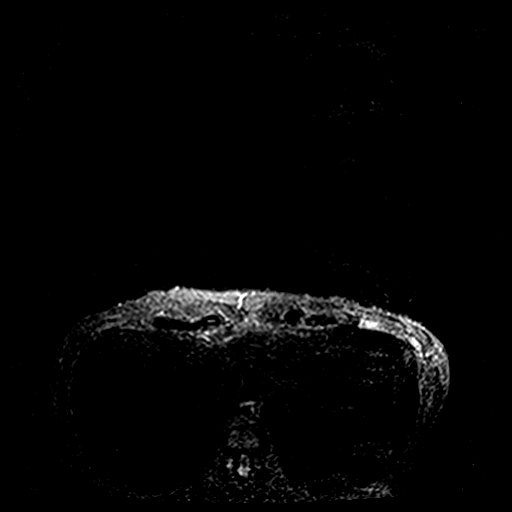

[Series 3: fl3d pre-cm no · axial · non-contrast · 1.2mm · 0.89mm/px · z∈[-97,+75]mm · 5 of 144 slices shown]
[im 1/144]
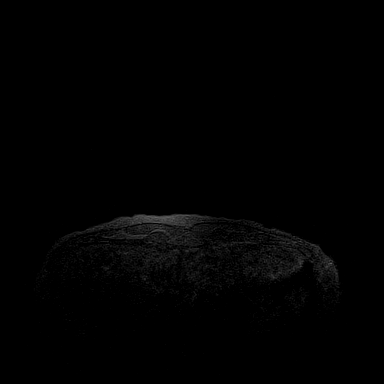
[im 36/144]
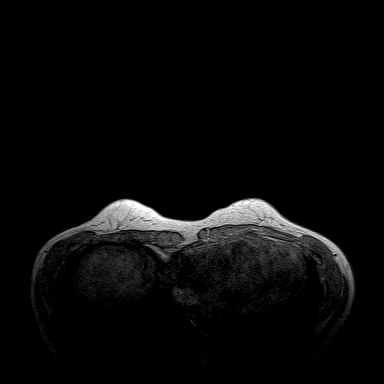
[im 72/144]
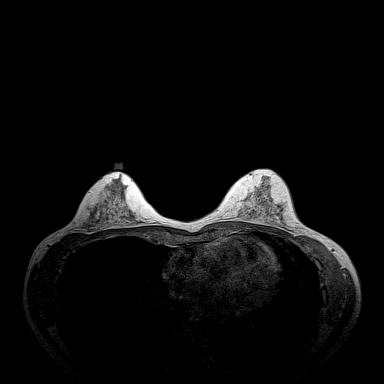
[im 108/144]
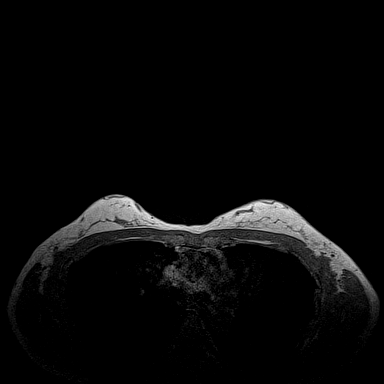
[im 144/144]
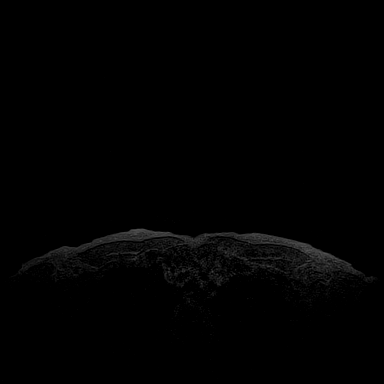

[Series 4: fl3d pre-cm · axial · non-contrast · 1.2mm · 0.89mm/px · z∈[-97,+75]mm · 5 of 144 slices shown]
[im 1/144]
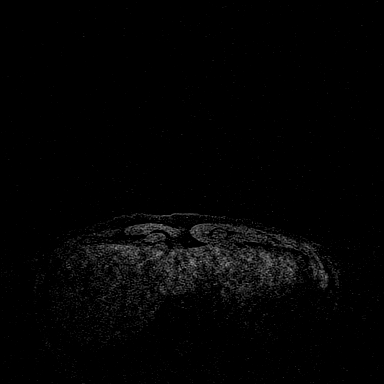
[im 36/144]
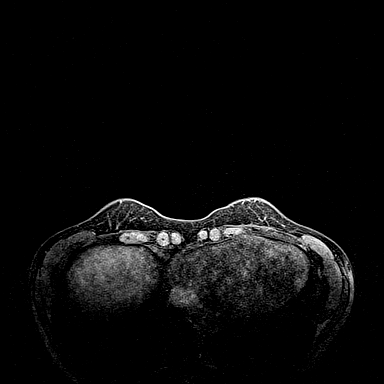
[im 72/144]
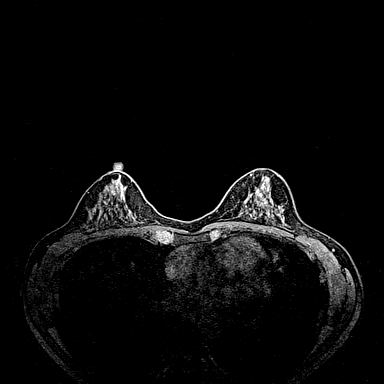
[im 108/144]
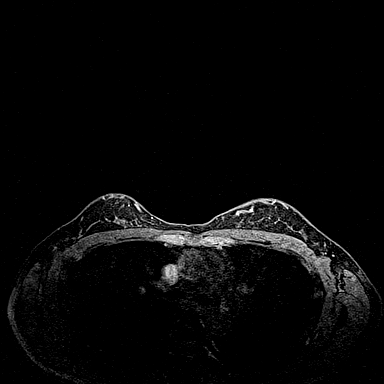
[im 144/144]
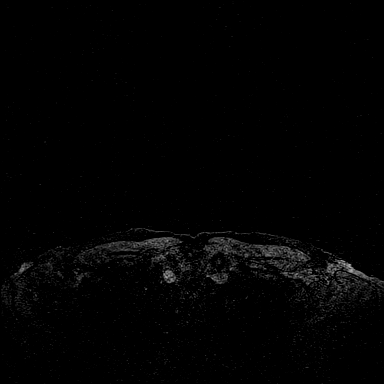

[Series 5: fl3d post-cm 20 · axial · 1.2mm · 0.89mm/px · z∈[-97,+75]mm · 5 of 144 slices shown (1 of 3)]
[im 1/144]
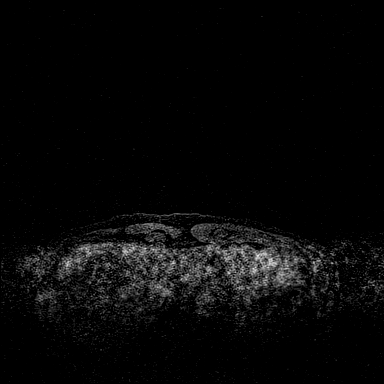
[im 36/144]
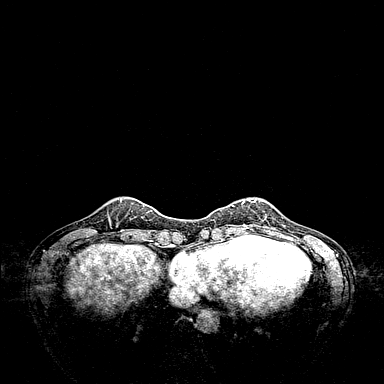
[im 72/144]
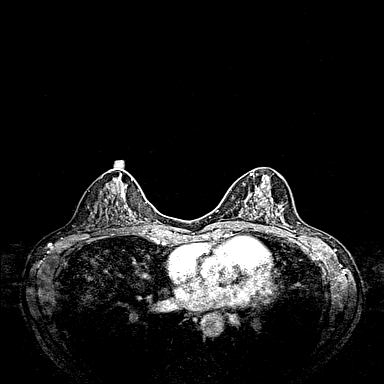
[im 108/144]
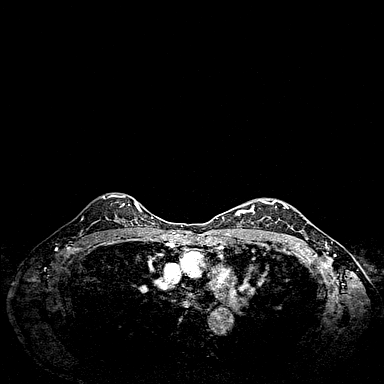
[im 144/144]
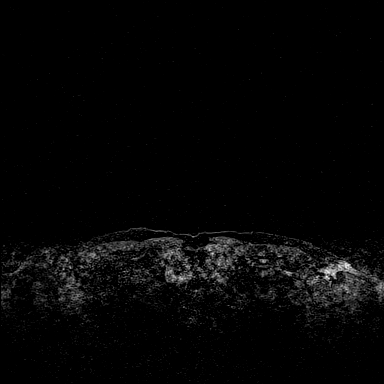

[Series 6: fl3d post-cm 20 · axial · 1.2mm · 0.89mm/px · z∈[-97,+75]mm · 5 of 144 slices shown (2 of 3)]
[im 1/144]
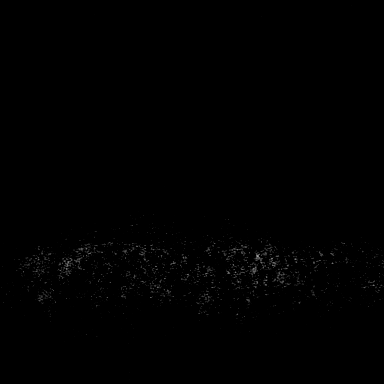
[im 36/144]
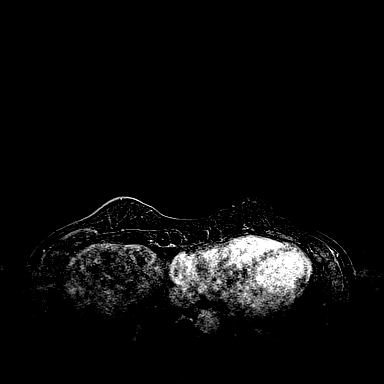
[im 72/144]
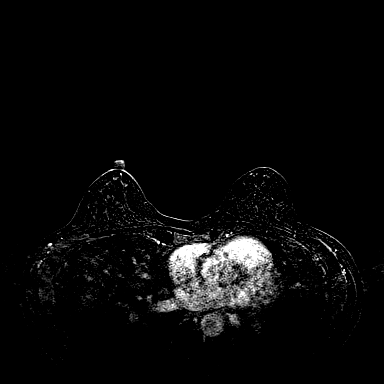
[im 108/144]
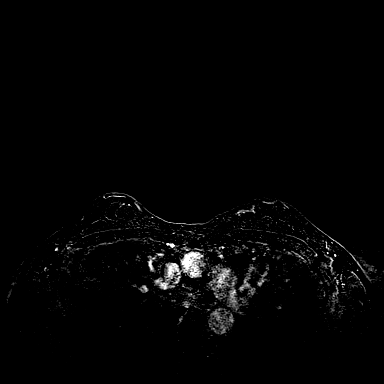
[im 144/144]
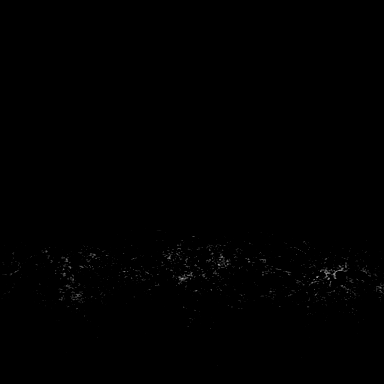

[Series 7: fl3d post-cm 20 · axial · 172.8mm · 0.89mm/px · 1 of 1 slices shown (3 of 3)]
[im 1/1]
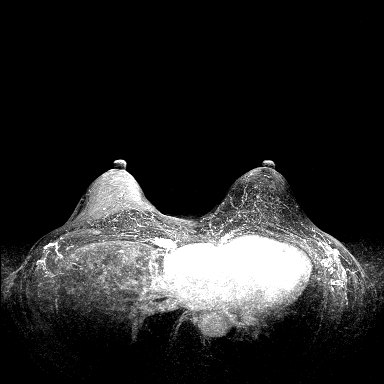

[Series 8: fl3d post-cm 3min · axial · 1.2mm · 0.89mm/px · z∈[-97,+75]mm · 6 of 144 slices shown]
[im 1/144]
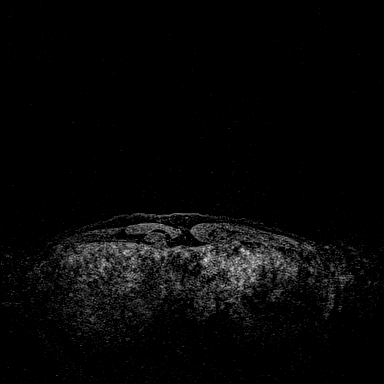
[im 29/144]
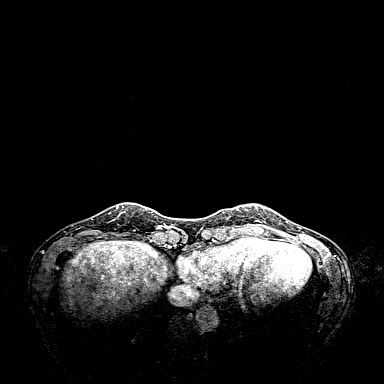
[im 58/144]
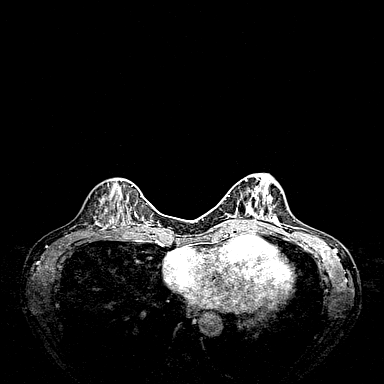
[im 86/144]
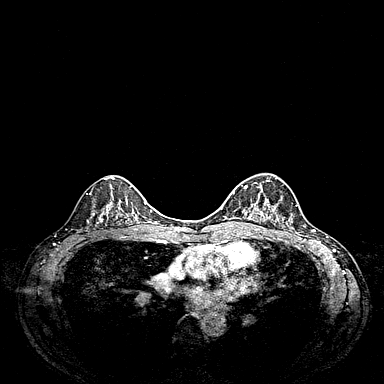
[im 115/144]
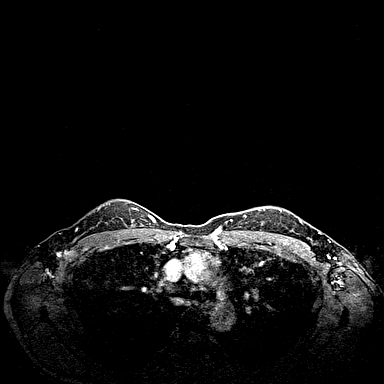
[im 144/144]
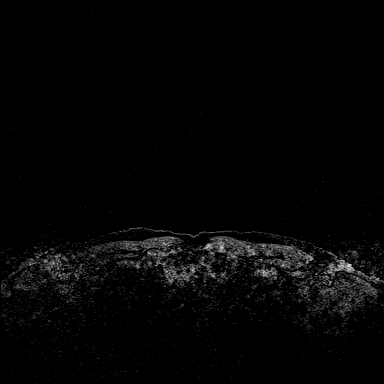

[Series 9: fl3d post-cm 3min_sub · axial · 1.2mm · 0.89mm/px · z∈[-97,+5]mm · 4 of 144 slices shown]
[im 1/144]
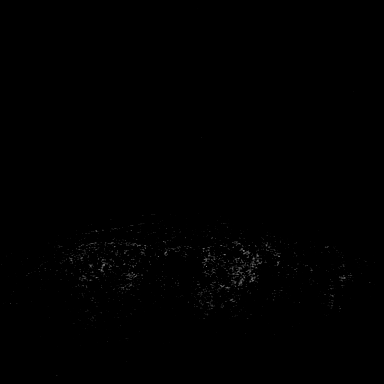
[im 29/144]
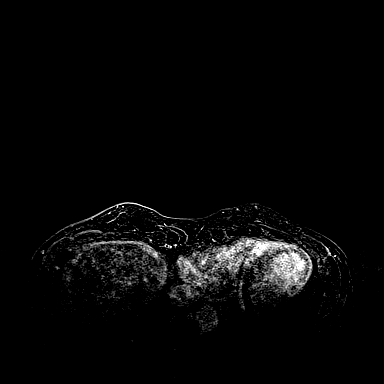
[im 58/144]
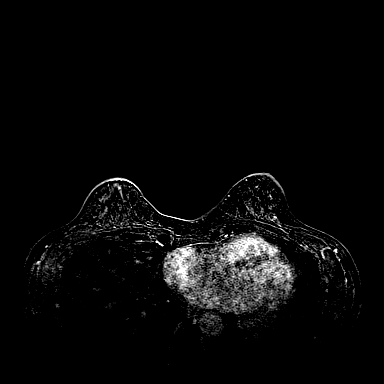
[im 86/144]
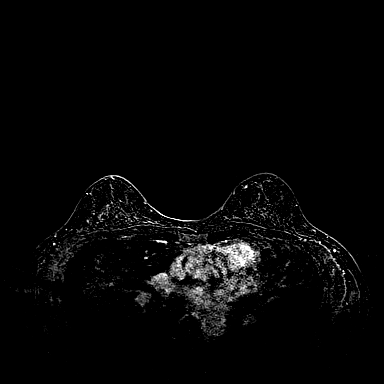

[32 of 48 positions shown; findings below may reference images not displayed]

Three-dimensional MR images were rendered by post-processing of the
original MR data on an independent workstation. The
three-dimensional MR images were interpreted, and findings are
reported in the following complete MRI report for this study. Three
dimensional images were evaluated at the independent interpreting
workstation using the DynaCAD thin client.
FINDINGS: Breast composition: c. Heterogeneous fibroglandular tissue.

Background parenchymal enhancement: Mild.

Right breast: No mass or abnormal enhancement. Biopsy marker clip
artifact in the posterior upper outer right breast. Small amount of
residual non mass enhancement in that area, some absent following
biopsy, compatible with mild residual fibrocystic changes.

Left breast: No mass or abnormal enhancement. The previously
demonstrated 5 mm mass in the lower central left breast is
surgically absent.

Lymph nodes: No abnormal appearing lymph nodes.

Ancillary findings:  None.
IMPRESSION: No evidence of malignancy.

RECOMMENDATION:
1. Bilateral screening mammogram in 3 months when due.
2. Per American Cancer Society guidelines, if the patient has a
calculated lifetime risk of developing breast cancer of greater than
20%, annual screening MRI of the breasts would also be recommended.

BI-RADS CATEGORY  1: Negative.

## 2020-10-23 MED ORDER — GADOBUTROL 1 MMOL/ML IV SOLN
5.0000 mL | Freq: Once | INTRAVENOUS | Status: AC | PRN
  Administered 2020-10-23: 5 mL via INTRAVENOUS

## 2020-11-12 NOTE — Progress Notes (Signed)
50 y.o. G68P1011 Divorced Caucasian female here for annual exam.    Off OCPs x 1 month--Rx ran out 4 - 6 weeks ago. Slept better taking LoLoEstrin.  Not having a lot of daytime hot flashes.  Declines STD screening.   Had genetic testing which was negative.   PCP:  None  Patient's last menstrual period was 10/09/2020 (exact date).     Period Cycle (Days): 30 Period Duration (Days): 4 Period Pattern: Regular Menstrual Flow: Moderate Menstrual Control: Tampon Dysmenorrhea: (!) Mild Dysmenorrhea Symptoms: Cramping     Sexually active: Yes.    The current method of family planning is condoms   Exercising: Yes.     Works out daily Smoker:  no  Health Maintenance: Pap: 09-11-16 Neg:Neg HR HPV, 08-10-13 Neg:Neg HR HPV History of abnormal Pap:  no MMG:  10-23-20 MRI Br.Bil/Neg/BiRads1--SEE Epic.  Had benign breast biopsy.  Colonoscopy:  n/a.  She will schedule. BMD:   n/a  Result  n/a TDaP: 06-09-11 Gardasil:   no HIV: 10-12-18 NR Hep C: 10-12-18 Neg Screening Labs:  today.   reports that she has never smoked. She has never used smokeless tobacco. She reports current alcohol use of about 2.0 standard drinks per week. She reports that she does not use drugs.  Past Medical History:  Diagnosis Date   Allergic rhinitis, cause unspecified    Increased risk of breast cancer    Migraines    without aura    Past Surgical History:  Procedure Laterality Date   LIPOMA EXCISION  03/2010   right flank   RADIOACTIVE SEED GUIDED EXCISIONAL BREAST BIOPSY Left 05/21/2020   Procedure: LEFT BREAST MASS SEED GUIDED EXCISIONAL BIOPSY;  Surgeon: Emelia Loron, MD;  [facility];  Service: General;  Laterality: Left;    Current Outpatient Medications  Medication Sig Dispense Refill   BEPREVE 1.5 % SOLN INSTILL 1 DROP INTO BOTH EYES TWICE A DAY AS NEEDED 5 mL 3   bimatoprost (LATISSE) 0.03 % ophthalmic solution PLACE ONE DROP VIA APPLICATOR EVENLY TO SKIN AT BASE of UPPER  EYELASHES. USE NEW APPLICATOR EACH EYE.     Calcium Carbonate Antacid (TUMS PO) Take by mouth daily.     cetirizine (ZYRTEC) 10 MG tablet Take 10 mg by mouth daily.     Epinastine HCl 0.05 % ophthalmic solution PLACE 1 DROP IN BOTH EYES TWO TIMES DAILY 5 mL 3   fenoprofen (NALFON) 600 MG TABS tablet Take 600 mg by mouth.     fluticasone (FLONASE) 50 MCG/ACT nasal spray Place 2 sprays into the nose as needed for rhinitis. 16 g 6   Ibuprofen (ADVIL PO) Take by mouth as needed.     Multiple Vitamins-Minerals (MULTIVITAMIN PO) Take by mouth daily.     Norethindrone-Ethinyl Estradiol-Fe Biphas (LO LOESTRIN FE) 1 MG-10 MCG / 10 MCG tablet TAKE 1 TABLET BY MOUTH DAILY. 84 tablet 3   No current facility-administered medications for this visit.    Family History  Problem Relation Age of Onset   Arthritis Mother    Breast cancer Mother 64   Prostate cancer Maternal Grandfather    Arthritis Maternal Grandfather    Arthritis Maternal Grandmother    Breast cancer Maternal Grandmother 66   Stroke Other     Review of Systems  All other systems reviewed and are negative.  Exam:   BP 138/88   Pulse (!) 109   Ht 5' 0.5" (1.537 m)   Wt 114 lb (51.7 kg)  LMP 10/09/2020 (Exact Date)   SpO2 96%   BMI 21.90 kg/m     General appearance: alert, cooperative and appears stated age Head: normocephalic, without obvious abnormality, atraumatic Neck: no adenopathy, supple, symmetrical, trachea midline and thyroid normal to inspection and palpation Lungs: clear to auscultation bilaterally Breasts: normal appearance, no masses or tenderness, No nipple retraction or dimpling, No nipple discharge or bleeding, No axillary adenopathy Heart: regular rate and rhythm Abdomen: soft, non-tender; no masses, no organomegaly Extremities: extremities normal, atraumatic, no cyanosis or edema Skin: skin color, texture, turgor normal. No rashes or lesions Lymph nodes: cervical, supraclavicular, and axillary nodes  normal. Neurologic: grossly normal  Pelvic: External genitalia:  no lesions              No abnormal inguinal nodes palpated.              Urethra:  normal appearing urethra with no masses, tenderness or lesions              Bartholins and Skenes: normal                 Vagina: normal appearing vagina with normal color and discharge, no lesions              Cervix: no lesions              Pap taken: no Bimanual Exam:  Uterus:  normal size, contour, position, consistency, mobility, non-tender              Adnexa: no mass, fullness, tenderness              Rectal exam: yes.  Confirms.              Anus:  normal sphincter tone, no lesions  Chaperone was present for exam:  Marchelle Folks, CMA.  Assessment:   Well woman visit with gynecologic exam. Hx low vit D.    Migraine without aura. Mother and maternal grandmother with breast cancer.  Patient has negative genetic testing.  Increased lifetime risk of breast cancer.  Family history of prostate cancer. Menopausal symptoms.   Plan: Mammogram screening discussed. Self breast awareness reviewed. Pap and HR HPV as above. Guidelines for Calcium, Vitamin D, regular exercise program including cardiovascular and weight bearing exercise. Check FSH, E2, AMH. Routine labs. Refill of LoLoEstrin. I did briefly discussed IUDs for pregnancy prevention.  We also discussed Effexor XR, Paxil and Neurontin for treating hot flashes.  Follow up annually and prn.   After visit summary provided.

## 2020-11-13 ENCOUNTER — Other Ambulatory Visit: Payer: Self-pay

## 2020-11-13 ENCOUNTER — Ambulatory Visit (INDEPENDENT_AMBULATORY_CARE_PROVIDER_SITE_OTHER): Payer: 59 | Admitting: Obstetrics and Gynecology

## 2020-11-13 ENCOUNTER — Encounter: Payer: Self-pay | Admitting: Obstetrics and Gynecology

## 2020-11-13 ENCOUNTER — Other Ambulatory Visit (HOSPITAL_COMMUNITY): Payer: Self-pay

## 2020-11-13 VITALS — BP 138/88 | HR 109 | Ht 60.5 in | Wt 114.0 lb

## 2020-11-13 DIAGNOSIS — R748 Abnormal levels of other serum enzymes: Secondary | ICD-10-CM | POA: Diagnosis not present

## 2020-11-13 DIAGNOSIS — N951 Menopausal and female climacteric states: Secondary | ICD-10-CM | POA: Diagnosis not present

## 2020-11-13 DIAGNOSIS — Z01419 Encounter for gynecological examination (general) (routine) without abnormal findings: Secondary | ICD-10-CM | POA: Diagnosis not present

## 2020-11-13 MED ORDER — NORETHIN-ETH ESTRAD-FE BIPHAS 1 MG-10 MCG / 10 MCG PO TABS
1.0000 | ORAL_TABLET | Freq: Every day | ORAL | 3 refills | Status: DC
  Filled 2020-11-13: qty 84, 84d supply, fill #0
  Filled 2021-02-04: qty 84, 84d supply, fill #1
  Filled 2021-04-30: qty 84, 84d supply, fill #2
  Filled 2021-07-17: qty 84, 84d supply, fill #3

## 2020-11-13 NOTE — Patient Instructions (Signed)
EXERCISE AND DIET:  We recommended that you start or continue a regular exercise program for good health. Regular exercise means any activity that makes your heart beat faster and makes you sweat.  We recommend exercising at least 30 minutes per day at least 3 days a week, preferably 4 or 5.  We also recommend a diet low in fat and sugar.  Inactivity, poor dietary choices and obesity can cause diabetes, heart attack, stroke, and kidney damage, among others.    ALCOHOL AND SMOKING:  Women should limit their alcohol intake to no more than 7 drinks/beers/glasses of wine (combined, not each!) per week. Moderation of alcohol intake to this level decreases your risk of breast cancer and liver damage. And of course, no recreational drugs are part of a healthy lifestyle.  And absolutely no smoking or even second hand smoke. Most people know smoking can cause heart and lung diseases, but did you know it also contributes to weakening of your bones? Aging of your skin?  Yellowing of your teeth and nails?  CALCIUM AND VITAMIN D:  Adequate intake of calcium and Vitamin D are recommended.  The recommendations for exact amounts of these supplements seem to change often, but generally speaking 600 mg of calcium (either carbonate or citrate) and 800 units of Vitamin D per day seems prudent. Certain women may benefit from higher intake of Vitamin D.  If you are among these women, your doctor will have told you during your visit.    PAP SMEARS:  Pap smears, to check for cervical cancer or precancers,  have traditionally been done yearly, although recent scientific advances have shown that most women can have pap smears less often.  However, every woman still should have a physical exam from her gynecologist every year. It will include a breast check, inspection of the vulva and vagina to check for abnormal growths or skin changes, a visual exam of the cervix, and then an exam to evaluate the size and shape of the uterus and  ovaries.  And after 50 years of age, a rectal exam is indicated to check for rectal cancers. We will also provide age appropriate advice regarding health maintenance, like when you should have certain vaccines, screening for sexually transmitted diseases, bone density testing, colonoscopy, mammograms, etc.   MAMMOGRAMS:  All women over 40 years old should have a yearly mammogram. Many facilities now offer a "3D" mammogram, which may cost around $50 extra out of pocket. If possible,  we recommend you accept the option to have the 3D mammogram performed.  It both reduces the number of women who will be called back for extra views which then turn out to be normal, and it is better than the routine mammogram at detecting truly abnormal areas.    COLONOSCOPY:  Colonoscopy to screen for colon cancer is recommended for all women at age 50.  We know, you hate the idea of the prep.  We agree, BUT, having colon cancer and not knowing it is worse!!  Colon cancer so often starts as a polyp that can be seen and removed at colonscopy, which can quite literally save your life!  And if your first colonoscopy is normal and you have no family history of colon cancer, most women don't have to have it again for 10 years.  Once every ten years, you can do something that may end up saving your life, right?  We will be happy to help you get it scheduled when you are ready.    Be sure to check your insurance coverage so you understand how much it will cost.  It may be covered as a preventative service at no cost, but you should check your particular policy.    Levonorgestrel intrauterine device (IUD) O que  este medicamento? O LEVONORGESTREL IUD (DIU)  um dispositivo contraceptivo (anticoncepcional). Este dispositivo  colocado dentro do tero por um profissional de sade.  usado para prevenir a Environmental education officer. Alguns dispositivos tambm podem ser utilizados para tratar a hemorragia intensa que ocorre durante o seu perodo  menstrual. Este medicamento pode ser usado para outros propsitos; em caso de dvidas, pergunte ao seu profissional de sade ou Social research officer, government. NOMES DE MARCAS COMUNS: Rutha Bouchard, LILETTA, Mirena, Skyla O que devo dizer a meu profissional de sade antes de tomar este medicamento? Precisam saber se voc tem algum dos seguintes problemas ou estados de sade: Papanicolau anormal cncer de mama, do tero ou do colo do tero diabetes endometrite infeco genital ou plvica atual ou no passado tem mais de um parceiro sexual ou seu parceiro tem mais de um parceiro sexual doenas cardacas histrico de Environmental education officer ectpica ou tubria problemas do sistema imunitrio DIU implantado doena ou tumor no fgado problemas com cogulos sanguneos ou tomando medicamentos que afinam o sangue convulses (crises convulsivas) [place] drogas injetveis tero com formato irregular sangramento vaginal que ainda no foi explicado reao estranha ou alergia ao levonorgestrel, a outros hormnios, ao silicone ou ao polietileno reao estranha ou alergia a outros medicamentos, alimentos, corantes ou conservantes est grvida ou tentando engravidar est amamentando Como devo usar este medicamento? Este dispositivo  colocado dentro do tero por um profissional de sade. Ser fornecida uma bula do paciente para o produto cada vez que este for inserido. No se esquea de ler atentamente essas informaes todas as vezes. O folheto pode mudar com frequncia. Para mais informaes a respeito do uso deste medicamento em crianas, fale com seu mdico ou profissional de sade. Pode ser preciso tomar alguns cuidados especiais. Superdosagem: Se achar que tomou uma superdosagem deste medicamento, entre em contato imediatamente com o [facility] ou v a [facility]. OBSERVAO: Este medicamento  s para voc. No compartilhe este medicamento com outras pessoas. E se eu deixar de tomar uma dose? Isto no se  aplica. Se deseja continuar usando este tipo de [employer], ele dever ser substitudo a cada 3 a 7 anos, dependendo da marca do dispositivo inserido. O que pode interagir com este medicamento? No se espera nenhuma interao. Informe o seu mdico ou profissional de sade sobre todos os medicamentos que toma. Esta lista pode no descrever todas as interaes possveis. D ao seu profissional de sade uma lista de todos os medicamentos, ervas medicinais, remdios de venda livre, ou suplementos alimentares que voc [place]. Diga tambm se voc fuma, bebe, ou [place] drogas ilcitas. Alguns destes podem interagir com o seu medicamento. Ao que devo ficar atento quando estiver [employer] medicamento? Consulte seu mdico ou profissional de sade para acompanhamento regular Dealer. Informe o seu mdico ou profissional de sade se voc ou o seu parceiro se tornarem HIV positivos ou contrarem uma doena sexualmente transmissvel. Este medicamento no protege contra uma possvel infeco pelo HIV, AIDS ou outras doenas sexualmente transmissveis. Voc pode verificar a colocao do DIU tocando a parte superior da vagina com os dedos limpos para apalpar os fios. No puxe os fios.  um bom hbito verificar o posicionamento do dispositivo aps cada perodo menstrual. Entre imediatamente em contato com o seu  mdico ou profissional de sade se seus dedos apalparem mais do corpo do DIU do que apenas os fios ou se no puder apalpar os fios. O DIU pode sair do corpo. Voc pode engravidar se o dispositivo sair. Se voc notar que o DIU est deslocado, use um mtodo anticoncepcional secundrio, como preservativos, e entre em contato com o seu mdico ou profissional de sade. Absorventes internos no interferem com a posio do DIU e podem ser usados durante o seu perodo menstrual. Portadoras deste DIU s podem passar por uma ressonncia magntica (RM) com segurana sob condies especficas. Antes de passar por  uma RM, informe o seu mdico ou profissional de sade que tem um DIU implantado e qual  o tipo do dispositivo. Que efeitos colaterais posso sentir aps usar este medicamento? Efeitos colaterais que devem ser informados ao seu mdico ou profissional de sade o mais rpido possvel: reaes alrgicas (erupo na pele, coceira ou urticria; inchao do rosto, dos lbios ou da lngua) cogulo sanguneo (dor no peito; falta de ar; dor, inchao ou calor na perna) dor no peito deprimido aumento da presso arterial infeco (febre, feridas genitais, dor ou dificuldade para urinar, dor ou sensibilidade plvica, corrimento vaginal incomum, coceira ou odor) fezes de cor clara leso heptica (urina amarela escura ou marrom; mal-estar geral ou sintomas de gripe; perda de apetite; dor no lado superior direito do abdome; cansao ou fraqueza fora do comum; amarelecimento [employer] ou da pele) menstruao atrasada derrame (alteraes na viso; confuso; dificuldade para falar ou entender os outros; cefaleia grave; dormncia sbita ou fraqueza no rosto, no brao ou na perna; dificuldade para andar; tontura; perda de equilbrio ou coordenao) sangramento vaginal anormal Efeitos colaterais que normalmente no precisam de cuidados mdicos (avise ao seu mdico ou profissional de sade se persistirem ou forem incmodos): acne menstruao alterada clicas, tonturas ou desmaio durante a insero do dispositivo dor de cabea ganho de peso Esta lista pode no descrever todos os efeitos colaterais possveis. Para mais orientaes sobre efeitos colaterais, consulte o seu mdico. Voc pode relatar a ocorrncia de efeitos colaterais  FDA pelo telefone [phone]. Onde devo guardar meu medicamento? Isto no se aplica. OBSERVAO: Este folheto  um resumo. Pode no cobrir todas as informaes possveis. Se tiver dvidas a respeito deste medicamento, fale com seu mdico, farmacutico ou profissional de sade.  2021  Elsevier/Gold Standard (2020-02-14 00:00:00)  Venlafaxine Extended-Release Capsules What is this medication? VENLAFAXINE (VEN la fax een) treats depression and anxiety. It increases the amount of serotonin and norepinephrine in the brain, hormones that help regulate mood. It belongs to a group of medications called SNRIs. This medicine may be used for other purposes; ask your health care provider or pharmacist if you have questions. COMMON BRAND NAME(S): Effexor XR What should I tell my care team before I take this medication? They need to know if you have any of these conditions: Bleeding disorders Glaucoma Heart disease High blood pressure High cholesterol Kidney disease Liver disease Low levels of sodium in the blood Mania or bipolar disorder Seizures Suicidal thoughts, plans, or attempt; a previous suicide attempt by you or a family Take medications that treat or prevent blood clots Thyroid disease An unusual or allergic reaction to venlafaxine, desvenlafaxine, other medications, foods, dyes, or preservatives Pregnant or trying to get pregnant Breast-feeding How should I use this medication? Take this medication by mouth with a full glass of water. Follow the directions on the prescription label. Do not cut, crush, or chew this medication. Take it  with food. If needed, the capsule may be carefully opened and the entire contents sprinkled on a spoonful of cool applesauce. Swallow the applesauce/pellet mixture right away without chewing and follow with a glass of water to ensure complete swallowing of the pellets. Try to take your medication at about the same time each day. Do not take your medication more often than directed. Do not stop taking this medication suddenly except upon the advice of your care team. Stopping this medication too quickly may cause serious side effects or your condition may worsen. A special MedGuide will be given to you by the pharmacist with each prescription  and refill. Be sure to read this information carefully each time. Talk to your care team regarding the use of this medication in children. Special care may be needed. Overdosage: If you think you have taken too much of this medicine contact a poison control center or emergency room at once. NOTE: This medicine is only for you. Do not share this medicine with others. What if I miss a dose? If you miss a dose, take it as soon as you can. If it is almost time for your next dose, take only that dose. Do not take double or extra doses. What may interact with this medication? Do not take this medication with any of the following: Certain medications for fungal infections like fluconazole, itraconazole, ketoconazole, posaconazole, voriconazole Cisapride Desvenlafaxine Dronedarone Duloxetine Levomilnacipran Linezolid MAOIs like Carbex, Eldepryl, Marplan, Nardil, and Parnate Methylene blue (injected into a vein) Milnacipran Pimozide Thioridazine This medication may also interact with the following: Amphetamines Aspirin and aspirin-like medications Certain medications for depression, anxiety, or psychotic disturbances Certain medications for migraine headaches like almotriptan, eletriptan, frovatriptan, naratriptan, rizatriptan, sumatriptan, zolmitriptan Certain medications for sleep Certain medications that treat or prevent blood clots like dalteparin, enoxaparin, warfarin Cimetidine Clozapine Diuretics Fentanyl Furazolidone Indinavir Isoniazid Lithium Metoprolol NSAIDS, medications for pain and inflammation, like ibuprofen or naproxen Other medications that prolong the QT interval (cause an abnormal heart rhythm) like dofetilide, ziprasidone Procarbazine Rasagiline Supplements like St. John's wort, kava kava, valerian Tramadol Tryptophan This list may not describe all possible interactions. Give your health care provider a list of all the medicines, herbs, non-prescription drugs,  or dietary supplements you use. Also tell them if you smoke, drink alcohol, or use illegal drugs. Some items may interact with your medicine. What should I watch for while using this medication? Tell your care team if your symptoms do not get better or if they get worse. Visit your care team for regular checks on your progress. Because it may take several weeks to see the full effects of this medication, it is important to continue your treatment as prescribed by your care team. Watch for new or worsening thoughts of suicide or depression. This includes sudden changes in mood, behaviors, or thoughts. These changes can happen at any time but are more common in the beginning of treatment or after a change in dose. Call your care team right away if you experience these thoughts or worsening depression. Manic episodes may happen in patients with bipolar disorder who take this medication. Watch for changes in feelings or behaviors such as feeling anxious, nervous, agitated, panicky, irritable, hostile, aggressive, impulsive, severely restless, overly excited and hyperactive, or trouble sleeping. These changes can happen at any time but are more common in the beginning of treatment or after a change in dose. Call your care team right away if you notice any of these symptoms. This medication can  cause an increase in blood pressure. Check with your care team for instructions on monitoring your blood pressure while taking this medication. You may get drowsy or dizzy. Do not drive, use machinery, or do anything that needs mental alertness until you know how this medication affects you. Do not stand or sit up quickly, especially if you are an older patient. This reduces the risk of dizzy or fainting spells. Alcohol may interfere with the effect of this medication. Avoid alcoholic drinks. Your mouth may get dry. Chewing sugarless gum, sucking hard candy and drinking plenty of water will help. Contact your care team if  the problem does not go away or is severe. What side effects may I notice from receiving this medication? Side effects that you should report to your care team as soon as possible: Allergic reactions-skin rash, itching, hives, swelling of the face, lips, tongue, or throat Bleeding-bloody or black, tar-like stools, red or dark brown urine, vomiting blood or brown material that looks like coffee grounds, small, red or purple spots on skin, unusual bleeding or bruising Heart rhythm changes-fast or irregular heartbeat, dizziness, feeling faint or lightheaded, chest pain, trouble breathing Increase in blood pressure Loss of appetite with weight loss Low sodium level-muscle weakness, fatigue, dizziness, headache, confusion Serotonin syndrome-irritability, confusion, fast or irregular heartbeat, muscle stiffness, twitching muscles, sweating, high fever, seizures, chills, vomiting, diarrhea Sudden eye pain or change in vision such as blurry vision, seeing halos around lights, vision loss Thoughts of suicide or self-harm, worsening mood, feelings of depression Side effects that usually do not require medical attention (report to your care team if they continue or are bothersome): Anxiety, nervousness Change in sex drive or performance Dizziness Dry mouth Excessive sweating Nausea Tremors or shaking Trouble sleeping This list may not describe all possible side effects. Call your doctor for medical advice about side effects. You may report side effects to FDA at 1-800-FDA-1088. Where should I keep my medication? Keep out of the reach of children and pets. Store at a controlled temperature between 20 and 25 degrees C (68 degrees and 77 degrees F), in a dry place. Throw away any unused medication after the expiration date. NOTE: This sheet is a summary. It may not cover all possible information. If you have questions about this medicine, talk to your doctor, pharmacist, or health care provider.  2022  Elsevier/Gold Standard (2020-01-22 14:56:43) Intrauterine Device Information An intrauterine device (IUD) is a medical device that is inserted into the uterus to prevent pregnancy. It is a small, T-shaped device that has one or two nylon strings hanging down from it. The strings hang out of the lower part of the uterus (cervix) to allow for future IUD removal. There are two types of IUDs: Hormone IUD. This type of IUD is made of plastic and contains the hormone progestin (synthetic progesterone). A hormone IUD may last 3-5 years. Copper IUD. This type of IUD has copper wire wrapped around it. A copper IUD may last up to 10 years. How is an IUD inserted? An IUD is inserted through the vagina, through the cervix, and into the uterus with a minor medical procedure. The procedure for IUD insertion may vary among health care providers and hospitals. How does an IUD work? Synthetic progesterone in a hormonal IUD prevents pregnancy by: Thickening cervical mucus to prevent sperm from entering the uterus. Thinning the uterine lining to prevent a fertilized egg from being implanted there. Copper in a copper IUD prevents pregnancy by making the uterus  and fallopian tubes produce a fluid that kills sperm. What are the advantages of an IUD? Advantages of either type of IUD An IUD: Is highly effective in preventing pregnancy. Is reversible. You can become pregnant shortly after the IUD is removed. Is low-maintenance and can stay in place for a long time. Has no estrogen-related side effects. Can be used when breastfeeding. Is not associated with weight gain. Can be inserted right after childbirth, an abortion, or a miscarriage. Advantages of a hormone IUD If it is inserted within 7 days of your period starting, it works right after it has been inserted. If the hormone IUD is inserted at any other time in your cycle, you will need to use a backup method of birth control for 7 days after insertion. It can  make menstrual periods lighter or stop completely. It can reduce menstrual cramping and other discomforts from menstrual periods. It can be used for 3-5 years, depending on which IUD you have. Advantages of a copper IUD It works right after it is inserted. It can be used as a form of emergency birth control if it is inserted within 5 days after having unprotected sex. It does not interfere with your body's natural hormones. It can be used for up to 10 years. What are the disadvantages of an IUD? An IUD may cause irregular menstrual bleeding for a period of time after insertion. It is common to have pain during insertion and have cramping and vaginal bleeding after insertion. An IUD may cut the uterus (uterine perforation) when it is inserted. This is rare. Pelvic inflammatory disease (PID) may happen after insertion of an IUD. PID is an infection in the uterus and fallopian tubes. The IUD does not cause the infection. The infection is usually from an unknown sexually transmitted infection (STI). This is rare, and it usually happens during the first 20 days after the IUD is inserted. A copper IUD can make your menstrual flow heavier and more painful. IUDs cannot prevent sexually transmitted infections (STIs). How is an IUD removed?  You will lie on your back with your knees bent and your feet in footrests (stirrups). A device will be inserted into your vagina to spread apart the vaginal walls (speculum). This will allow your health care provider to see the strings attached to the IUD. Your health care provider will use a small instrument (forceps) to grasp the IUD strings and will pull firmly until the IUD is removed. You may have some discomfort when the IUD is removed. Your health care provider may recommend taking over-the-counter pain relievers, such as ibuprofen, before the procedure. You may also have minor spotting for a few days after the procedure. The procedure for IUD removal may vary  among health care providers and hospitals. Is an IUD right for me? If you are interested in an IUD, discuss it with your health care provider. He or she will make sure you are a good candidate for an IUD and will let you know more about the advantages, disadvantage, and possible side effects. This will allow you to make a decision about the device. Summary An intrauterine device (IUD) is a medical device that is inserted in the uterus to prevent pregnancy. It is a small, T-shaped device that has one or two nylon strings hanging down from it. A hormone IUD contains the hormone progestin (synthetic progesterone). A copper IUD has copper wire wrapped around it. Synthetic progesterone in a hormone IUD prevents pregnancy by thickening cervical mucus and  thinning the walls of the uterus. Copper in a copper IUD prevents pregnancy by making the uterus and fallopian tubes produce a fluid that kills sperm. A hormone IUD can be left in place for 3-5 years. A copper IUD can be left in place for up to 10 years. An IUD is inserted and removed by a health care provider. You may feel some pain during insertion and removal. Your health care provider may recommend taking over-the-counter pain medicine, such as ibuprofen, before an IUD procedure. This information is not intended to replace advice given to you by your health care provider. Make sure you discuss any questions you have with your health care provider. Document Revised: 09/13/2019 Document Reviewed: 09/13/2019 Elsevier Patient Education  2022 [employer].

## 2020-11-15 ENCOUNTER — Encounter: Payer: Self-pay | Admitting: Obstetrics and Gynecology

## 2020-11-15 NOTE — Addendum Note (Signed)
Addended by: Ardell Isaacs, Debbe Bales E on: 11/15/2020 08:49 AM   Modules accepted: Orders

## 2020-11-17 LAB — LIPID PANEL
Cholesterol: 243 mg/dL — ABNORMAL HIGH (ref <200)
HDL: 94 mg/dL (ref >=50)
LDL Cholesterol (Calc): 127 mg/dL (calc) — ABNORMAL HIGH
Non-HDL Cholesterol (Calc): 149 mg/dL (calc) — ABNORMAL HIGH (ref <130)
Total CHOL/HDL Ratio: 2.6 (calc) (ref <5.0)
Triglycerides: 109 mg/dL (ref <150)

## 2020-11-17 LAB — COMPREHENSIVE METABOLIC PANEL
AG Ratio: 2.3 (calc) (ref 1.0–2.5)
ALT: 55 U/L — ABNORMAL HIGH (ref 6–29)
AST: 43 U/L — ABNORMAL HIGH (ref 10–35)
Albumin: 5 g/dL (ref 3.6–5.1)
Alkaline phosphatase (APISO): 44 U/L (ref 31–125)
BUN: 15 mg/dL (ref 7–25)
CO2: 30 mmol/L (ref 20–32)
Calcium: 10 mg/dL (ref 8.6–10.2)
Chloride: 103 mmol/L (ref 98–110)
Creat: 0.78 mg/dL (ref 0.50–0.99)
Globulin: 2.2 g/dL (calc) (ref 1.9–3.7)
Glucose, Bld: 103 mg/dL — ABNORMAL HIGH (ref 65–99)
Potassium: 4.6 mmol/L (ref 3.5–5.3)
Sodium: 140 mmol/L (ref 135–146)
Total Bilirubin: 1.1 mg/dL (ref 0.2–1.2)
Total Protein: 7.2 g/dL (ref 6.1–8.1)

## 2020-11-17 LAB — CBC
HCT: 43.2 % (ref 35.0–45.0)
Hemoglobin: 14.1 g/dL (ref 11.7–15.5)
MCH: 30.1 pg (ref 27.0–33.0)
MCHC: 32.6 g/dL (ref 32.0–36.0)
MCV: 92.3 fL (ref 80.0–100.0)
MPV: 10.3 fL (ref 7.5–12.5)
Platelets: 279 10*3/uL (ref 140–400)
RBC: 4.68 10*6/uL (ref 3.80–5.10)
RDW: 11.6 % (ref 11.0–15.0)
WBC: 4.2 10*3/uL (ref 3.8–10.8)

## 2020-11-17 LAB — ESTRADIOL: Estradiol: <15 pg/mL

## 2020-11-17 LAB — VITAMIN D 25 HYDROXY (VIT D DEFICIENCY, FRACTURES): Vit D, 25-Hydroxy: 40 ng/mL (ref 30–100)

## 2020-11-17 LAB — ANTI-MULLERIAN HORMONE (AMH), FEMALE: Anti-Mullerian Hormones(AMH), Female: 0.02 ng/mL

## 2020-11-17 LAB — FOLLICLE STIMULATING HORMONE: FSH: 111.1 m[IU]/mL

## 2020-11-20 ENCOUNTER — Encounter: Payer: Self-pay | Admitting: Gastroenterology

## 2020-12-19 ENCOUNTER — Ambulatory Visit: Payer: 59 | Attending: Internal Medicine

## 2020-12-19 DIAGNOSIS — Z23 Encounter for immunization: Secondary | ICD-10-CM

## 2020-12-19 NOTE — Progress Notes (Signed)
   [facility] Vaccination [facility]  Name:  Alyssa Livingston    MRN: [id number] DOB: 01-24-1971  12/19/2020  Alyssa Livingston was observed post Covid-19 immunization for 15 minutes without incident. She was provided with Vaccine Information Sheet and instruction to access the V-Safe system.   Alyssa Livingston was instructed to call [phone] with any severe reactions post vaccine: Difficulty breathing  Swelling of face and throat  A fast heartbeat  A bad rash all over body  Dizziness and weakness

## 2020-12-26 DIAGNOSIS — H5213 Myopia, bilateral: Secondary | ICD-10-CM | POA: Diagnosis not present

## 2020-12-27 ENCOUNTER — Other Ambulatory Visit (HOSPITAL_BASED_OUTPATIENT_CLINIC_OR_DEPARTMENT_OTHER): Payer: Self-pay

## 2020-12-27 MED ORDER — COVID-19MRNA BIVAL VACC PFIZER 30 MCG/0.3ML IM SUSP
INTRAMUSCULAR | 0 refills | Status: DC
  Filled 2020-12-27: qty 0.3, 1d supply, fill #0

## 2020-12-30 ENCOUNTER — Ambulatory Visit: Payer: 59

## 2020-12-31 ENCOUNTER — Other Ambulatory Visit: Payer: 59

## 2020-12-31 ENCOUNTER — Other Ambulatory Visit: Payer: Self-pay

## 2020-12-31 DIAGNOSIS — R748 Abnormal levels of other serum enzymes: Secondary | ICD-10-CM | POA: Diagnosis not present

## 2021-01-01 LAB — COMPREHENSIVE METABOLIC PANEL
AG Ratio: 1.8 (calc) (ref 1.0–2.5)
ALT: 13 U/L (ref 6–29)
AST: 15 U/L (ref 10–35)
Albumin: 4.5 g/dL (ref 3.6–5.1)
Alkaline phosphatase (APISO): 37 U/L (ref 31–125)
BUN: 16 mg/dL (ref 7–25)
CO2: 27 mmol/L (ref 20–32)
Calcium: 9.4 mg/dL (ref 8.6–10.2)
Chloride: 103 mmol/L (ref 98–110)
Creat: 0.79 mg/dL (ref 0.50–0.99)
Globulin: 2.5 g/dL (calc) (ref 1.9–3.7)
Glucose, Bld: 88 mg/dL (ref 65–99)
Potassium: 3.9 mmol/L (ref 3.5–5.3)
Sodium: 137 mmol/L (ref 135–146)
Total Bilirubin: 0.7 mg/dL (ref 0.2–1.2)
Total Protein: 7 g/dL (ref 6.1–8.1)

## 2021-01-09 ENCOUNTER — Other Ambulatory Visit (HOSPITAL_COMMUNITY): Payer: Self-pay

## 2021-01-09 ENCOUNTER — Other Ambulatory Visit: Payer: Self-pay

## 2021-01-09 ENCOUNTER — Ambulatory Visit (AMBULATORY_SURGERY_CENTER): Payer: 59

## 2021-01-09 ENCOUNTER — Encounter: Payer: Self-pay | Admitting: Gastroenterology

## 2021-01-09 VITALS — Ht 61.0 in | Wt 112.0 lb

## 2021-01-09 DIAGNOSIS — Z1211 Encounter for screening for malignant neoplasm of colon: Secondary | ICD-10-CM

## 2021-01-09 MED ORDER — NA SULFATE-K SULFATE-MG SULF 17.5-3.13-1.6 GM/177ML PO SOLN
1.0000 | Freq: Once | ORAL | 0 refills | Status: AC
  Filled 2021-01-09: qty 354, 1d supply, fill #0

## 2021-01-09 NOTE — Progress Notes (Signed)

## 2021-01-16 ENCOUNTER — Other Ambulatory Visit (HOSPITAL_COMMUNITY): Payer: Self-pay

## 2021-01-23 ENCOUNTER — Encounter: Payer: Self-pay | Admitting: Gastroenterology

## 2021-01-23 ENCOUNTER — Other Ambulatory Visit: Payer: Self-pay

## 2021-01-23 ENCOUNTER — Ambulatory Visit (AMBULATORY_SURGERY_CENTER): Payer: 59 | Admitting: Gastroenterology

## 2021-01-23 VITALS — BP 104/50 | HR 50 | Temp 98.9°F | Resp 13 | Ht 61.0 in | Wt 112.0 lb

## 2021-01-23 DIAGNOSIS — Z1211 Encounter for screening for malignant neoplasm of colon: Secondary | ICD-10-CM | POA: Diagnosis not present

## 2021-01-23 MED ORDER — SODIUM CHLORIDE 0.9 % IV SOLN: 500.0000 mL | Freq: Once | INTRAVENOUS | Status: DC

## 2021-01-23 NOTE — Patient Instructions (Signed)
YOU HAD AN ENDOSCOPIC PROCEDURE TODAY AT THE [facility]:   Refer to the procedure report that was given to you for any specific questions about what was found during the examination.  If the procedure report does not answer your questions, please call your gastroenterologist to clarify.  If you requested that your care partner not be given the details of your procedure findings, then the procedure report has been included in a sealed envelope for you to review at your convenience later.  YOU SHOULD EXPECT: Some feelings of bloating in the abdomen. Passage of more gas than usual.  Walking can help get rid of the air that was put into your GI tract during the procedure and reduce the bloating. If you had a lower endoscopy (such as a colonoscopy or flexible sigmoidoscopy) you may notice spotting of blood in your stool or on the toilet paper. If you underwent a bowel prep for your procedure, you may not have a normal bowel movement for a few days.  Please Note:  You might notice some irritation and congestion in your nose or some drainage.  This is from the oxygen used during your procedure.  There is no need for concern and it should clear up in a day or so.  SYMPTOMS TO REPORT IMMEDIATELY:   Following lower endoscopy (colonoscopy or flexible sigmoidoscopy):  Excessive amounts of blood in the stool  Significant tenderness or worsening of abdominal pains  Swelling of the abdomen that is new, acute  Fever of 100F or higher  For urgent or emergent issues, a gastroenterologist can be reached at any hour by calling [phone]. Do not use MyChart messaging for urgent concerns.    DIET:  We do recommend a small meal at first, but then you may proceed to your regular diet.  Drink plenty of fluids but you should avoid alcoholic beverages for 24 hours.  ACTIVITY:  You should plan to take it easy for the rest of today and you should NOT DRIVE or use heavy machinery until tomorrow (because  of the sedation medicines used during the test).    FOLLOW UP: Our staff will call the number listed on your records 48-72 hours following your procedure to check on you and address any questions or concerns that you may have regarding the information given to you following your procedure. If we do not reach you, we will leave a message.  We will attempt to reach you two times.  During this call, we will ask if you have developed any symptoms of COVID 19. If you develop any symptoms (ie: fever, flu-like symptoms, shortness of breath, cough etc.) before then, please call [phone].  If you test positive for Covid 19 in the 2 weeks post procedure, please call and report this information to us.    If any biopsies were taken you will be contacted by phone or by letter within the next 1-3 weeks.  Please call us at [phone] if you have not heard about the biopsies in 3 weeks.    SIGNATURES/CONFIDENTIALITY: You and/or your care partner have signed paperwork which will be entered into your electronic medical record.  These signatures attest to the fact that that the information above on your After Visit Summary has been reviewed and is understood.  Full responsibility of the confidentiality of this discharge information lies with you and/or your care-partner. 

## 2021-01-23 NOTE — Progress Notes (Signed)
Pt's states no medical or surgical changes since previsit or office visit. 

## 2021-01-23 NOTE — Progress Notes (Signed)
[facility] History and Physical   Primary Care Physician:  Pcp, No   Reason for Procedure:  Colorectal cancer screening  Plan:    Screening colonoscopy with possible interventions as needed     HPI: Alyssa Livingston is a very pleasant 50 y.o. female here for screening colonoscopy. Denies any nausea, vomiting, abdominal pain, melena or bright red blood per rectum  The risks and benefits as well as alternatives of endoscopic procedure(s) have been discussed and reviewed. All questions answered. The patient agrees to proceed.    Past Medical History:  Diagnosis Date   Allergic rhinitis, cause unspecified    Elevated liver enzymes 2022   Increased risk of breast cancer    Migraines    without aura    Past Surgical History:  Procedure Laterality Date   LIPOMA EXCISION  03/2010   right flank   RADIOACTIVE SEED GUIDED EXCISIONAL BREAST BIOPSY Left 05/21/2020   Procedure: LEFT BREAST MASS SEED GUIDED EXCISIONAL BIOPSY;  Surgeon: Emelia Loron, MD;  Location: [facility];  Service: General;  Laterality: Left;    Prior to Admission medications   Medication Sig Start Date End Date Taking? Authorizing Provider  BEPREVE 1.5 % SOLN INSTILL 1 DROP INTO BOTH EYES TWICE A DAY AS NEEDED 03/28/20 03/28/21 Yes Skelton, Vernona Rieger, OD  bimatoprost (LATISSE) 0.03 % ophthalmic solution PLACE ONE DROP VIA APPLICATOR EVENLY TO SKIN AT BASE of UPPER EYELASHES. USE NEW APPLICATOR EACH EYE. 06/24/20  Yes [provider]  cetirizine (ZYRTEC) 10 MG tablet Take 10 mg by mouth daily.   Yes [provider]  fluticasone (FLONASE) 50 MCG/ACT nasal spray Place 2 sprays into the nose as needed for rhinitis. 08/05/12  Yes Romine, Edwena Felty, MD  Multiple Vitamins-Minerals (MULTIVITAMIN PO) Take by mouth daily.   Yes [provider]  Norethindrone-Ethinyl Estradiol-Fe Biphas (LO LOESTRIN FE) 1 MG-10 MCG / 10 MCG tablet TAKE 1 TABLET BY MOUTH DAILY. 11/13/20 11/13/21  Yes Amundson Shirley Friar, MD  Calcium Carbonate Antacid (TUMS PO) Take by mouth daily.    [provider]  COVID-19 mRNA bivalent vaccine, Pfizer, injection Inject into the muscle. Patient not taking: No sig reported 12/19/20   Judyann Munson, MD  Ibuprofen (ADVIL PO) Take by mouth as needed. Patient not taking: No sig reported    [provider]    Current Outpatient Medications  Medication Sig Dispense Refill   BEPREVE 1.5 % SOLN INSTILL 1 DROP INTO BOTH EYES TWICE A DAY AS NEEDED 5 mL 3   bimatoprost (LATISSE) 0.03 % ophthalmic solution PLACE ONE DROP VIA APPLICATOR EVENLY TO SKIN AT BASE of UPPER EYELASHES. USE NEW APPLICATOR EACH EYE.     cetirizine (ZYRTEC) 10 MG tablet Take 10 mg by mouth daily.     fluticasone (FLONASE) 50 MCG/ACT nasal spray Place 2 sprays into the nose as needed for rhinitis. 16 g 6   Multiple Vitamins-Minerals (MULTIVITAMIN PO) Take by mouth daily.     Norethindrone-Ethinyl Estradiol-Fe Biphas (LO LOESTRIN FE) 1 MG-10 MCG / 10 MCG tablet TAKE 1 TABLET BY MOUTH DAILY. 84 tablet 3   Calcium Carbonate Antacid (TUMS PO) Take by mouth daily.     COVID-19 mRNA bivalent vaccine, Pfizer, injection Inject into the muscle. (Patient not taking: No sig reported) 0.3 mL 0   Ibuprofen (ADVIL PO) Take by mouth as needed. (Patient not taking: No sig reported)     Current Facility-Administered Medications  Medication Dose Route Frequency Provider Last Rate  Last Admin   0.9 %  sodium chloride infusion  500 mL Intravenous Once Napoleon Form, MD        Allergies as of 01/23/2021   (No Known Allergies)    Family History  Problem Relation Age of Onset   Colon polyps Mother    Arthritis Mother    Breast cancer Mother 17   Arthritis Maternal Grandmother    Breast cancer Maternal Grandmother 7   Prostate cancer Maternal Grandfather    Arthritis Maternal Grandfather    Stroke Other    Colon cancer Neg Hx    Esophageal cancer Neg Hx    Rectal  cancer Neg Hx    Stomach cancer Neg Hx     Social History   Socioeconomic History   Marital status: Divorced    Spouse name: Not on file   Number of children: Not on file   Years of education: Not on file   Highest education level: Not on file  Occupational History   Not on file  Tobacco Use   Smoking status: Never   Smokeless tobacco: Never   Tobacco comments:    works at [employer], divorced, lives with kid  Vaping Use   Vaping Use: Never used  Substance and Sexual Activity   Alcohol use: Yes    Alcohol/week: 2.0 standard drinks    Types: 2 Glasses of wine per week    Comment: 1-2 glasses of wine a week   Drug use: No   Sexual activity: Yes    Partners: Male    Birth control/protection: Condom  Other Topics Concern   Not on file  Social History Narrative   Not on file   Social Determinants of Health   Financial Resource Strain: Not on file  Food Insecurity: Not on file  Transportation Needs: Not on file  Physical Activity: Not on file  Stress: Not on file  Social Connections: Not on file  Intimate Partner Violence: Not on file    Review of Systems:  All other review of systems negative except as mentioned in the HPI.  Physical Exam: Vital signs in last 24 hours: BP 135/89   Pulse 91   Temp 98.9 F (37.2 C) (Temporal)   Ht 5\' 1"  (1.549 m)   Wt 112 lb (50.8 kg)   SpO2 100%   BMI 21.16 kg/m     General:   Alert, NAD Lungs:  Clear .   Heart:  Regular rate and rhythm Abdomen:  Soft, nontender and nondistended. Neuro/Psych:  Alert and cooperative. Normal mood and affect. A and O x 3  Reviewed labs, radiology imaging, old records and pertinent past GI work up  Patient is appropriate for planned procedure(s) and anesthesia in an ambulatory setting   K. , MD [phone]

## 2021-01-23 NOTE — Progress Notes (Signed)
PT taken to PACU. Monitors in place. VSS. Report given to RN. 

## 2021-01-23 NOTE — Op Note (Signed)
[facility] Patient Name: Alyssa Livingston Procedure Date: 01/23/2021 8:43 AM MRN: [id number] Endoscopist: Napoleon Form , MD Age: 50 Referring MD:  Date of Birth: 12/04/1970 Gender: Female Account #: [id number] Procedure:                Colonoscopy Indications:              Screening for colorectal malignant neoplasm Medicines:                Monitored Anesthesia Care Procedure:                Pre-Anesthesia Assessment:                           - Prior to the procedure, a History and Physical                            was performed, and patient medications and                            allergies were reviewed. The patient's tolerance of                            previous anesthesia was also reviewed. The risks                            and benefits of the procedure and the sedation                            options and risks were discussed with the patient.                            All questions were answered, and informed consent                            was obtained. Prior Anticoagulants: The patient has                            taken no previous anticoagulant or antiplatelet                            agents. ASA Grade Assessment: I - A normal, healthy                            patient. After reviewing the risks and benefits,                            the patient was deemed in satisfactory condition to                            undergo the procedure.                           After obtaining informed consent, the colonoscope  was passed under direct vision. Throughout the                            procedure, the patient's blood pressure, pulse, and                            oxygen saturations were monitored continuously. The                            Olympus PCF-[id number] (#[id number]) Colonoscope was                            introduced through the anus and advanced to the the                            cecum, identified by  appendiceal orifice and                            ileocecal valve. The colonoscopy was performed                            without difficulty. The patient tolerated the                            procedure well. The quality of the bowel                            preparation was excellent. The ileocecal valve,                            appendiceal orifice, and rectum were photographed. Scope In: 9:00:26 AM Scope Out: 9:18:16 AM Scope Withdrawal Time: 0 hours 13 minutes 14 seconds  Total Procedure Duration: 0 hours 17 minutes 50 seconds  Findings:                 The perianal and digital rectal examinations were                            normal.                           Non-bleeding internal hemorrhoids were found during                            retroflexion. The hemorrhoids were small.                           The exam was otherwise without abnormality. Complications:            No immediate complications. Estimated Blood Loss:     Estimated blood loss: none. Impression:               - Non-bleeding internal hemorrhoids.                           - The examination was otherwise normal.                           -  No specimens collected. Recommendation:           - Patient has a contact number available for                            emergencies. The signs and symptoms of potential                            delayed complications were discussed with the                            patient. Return to normal activities tomorrow.                            Written discharge instructions were provided to the                            patient.                           - Resume previous diet.                           - Continue present medications.                           - Repeat colonoscopy in 10 years for screening                            purposes. Napoleon Form, MD 01/23/2021 9:23:07 AM This report has been signed electronically.

## 2021-01-27 ENCOUNTER — Telehealth: Payer: Self-pay

## 2021-01-27 NOTE — Telephone Encounter (Signed)
  Follow up Call-  Call back number 01/23/2021  Post procedure Call Back phone  # [phone]  Permission to leave phone message Yes  Some recent data might be hidden     Patient questions:  Do you have a fever, pain , or abdominal swelling? No. Pain Score  0 *  Have you tolerated food without any problems? Yes.    Have you been able to return to your normal activities? Yes.    Do you have any questions about your discharge instructions: Diet   No. Medications  No. Follow up visit  No.  Do you have questions or concerns about your Care? No.  Actions: * If pain score is 4 or above: No action needed, pain <4.

## 2021-02-03 ENCOUNTER — Ambulatory Visit
Admission: RE | Admit: 2021-02-03 | Discharge: 2021-02-03 | Disposition: A | Discharge status: Home / Self Care | Payer: 59 | Source: Ambulatory Visit | Attending: Obstetrics and Gynecology | Admitting: Obstetrics and Gynecology

## 2021-02-03 DIAGNOSIS — Z1231 Encounter for screening mammogram for malignant neoplasm of breast: Secondary | ICD-10-CM | POA: Diagnosis not present

## 2021-02-03 IMAGING — MG MM DIGITAL SCREENING BILAT W/ TOMO AND CAD
8 series · 9 of 24 positions shown · non-contrast
Comparison: Previous exam(s).

CLINICAL DATA: Screening.

EXAM:
DIGITAL SCREENING BILATERAL MAMMOGRAM WITH TOMOSYNTHESIS AND CAD
TECHNIQUE: Bilateral screening digital craniocaudal and mediolateral oblique
mammograms were obtained. Bilateral screening digital breast
tomosynthesis was performed. The images were evaluated with
computer-aided detection.

[R CC synth-2D]
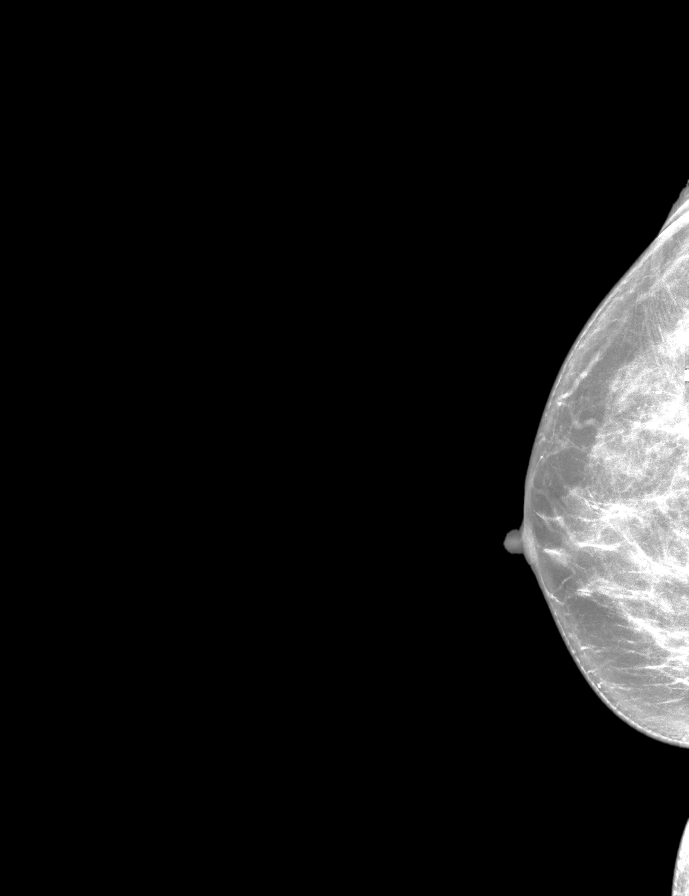

[L CC synth-2D]
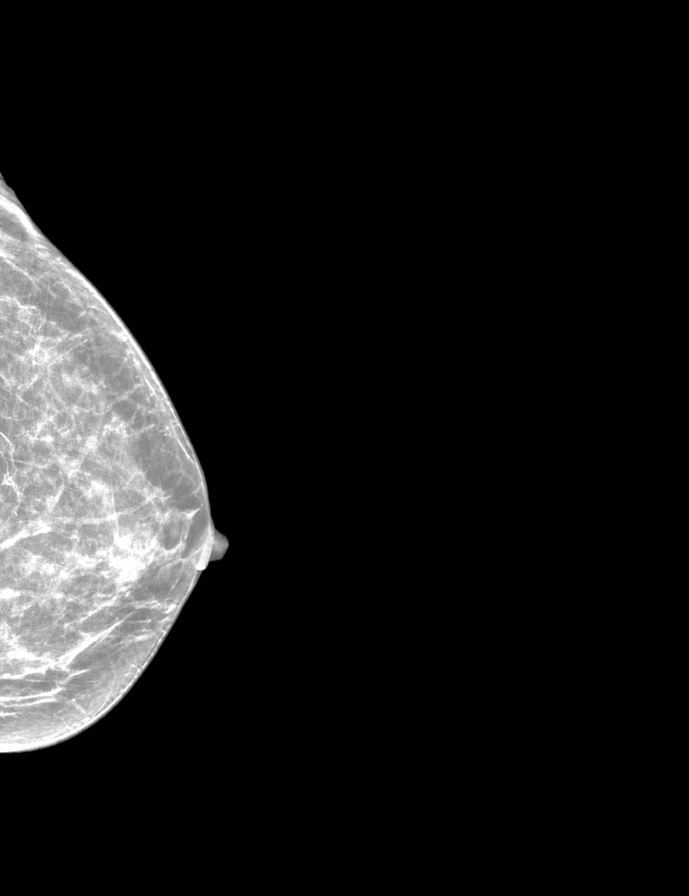

[R MLO synth-2D]
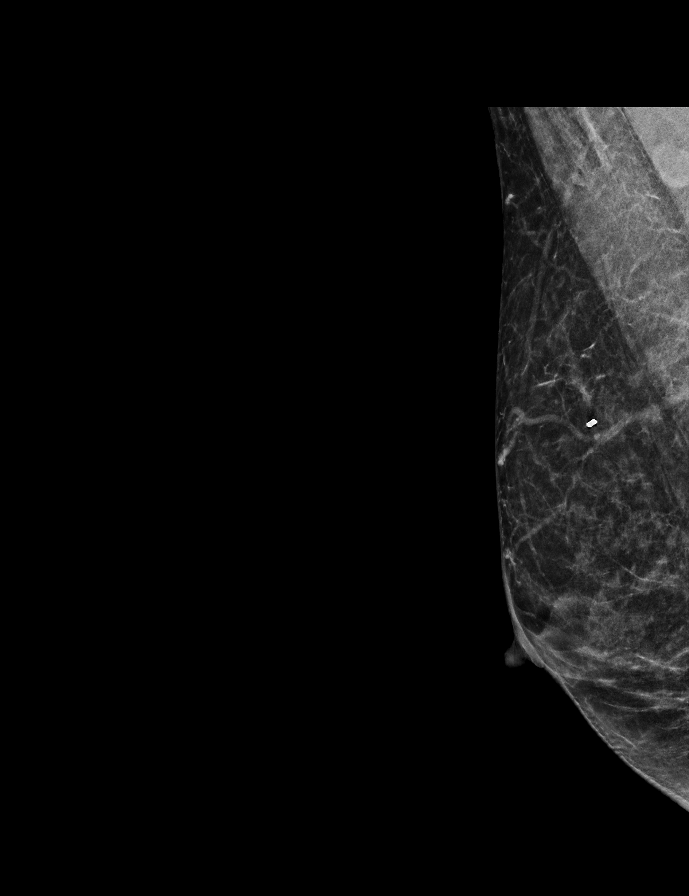

[L MLO synth-2D]
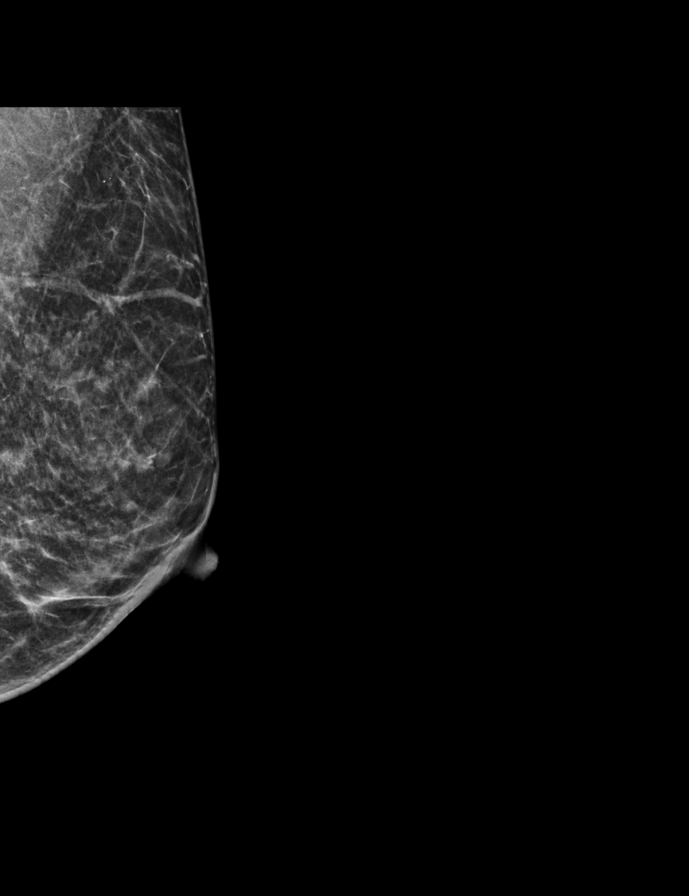

[R CC tomo · 2 of 56 frames shown]
[frame 19/56]
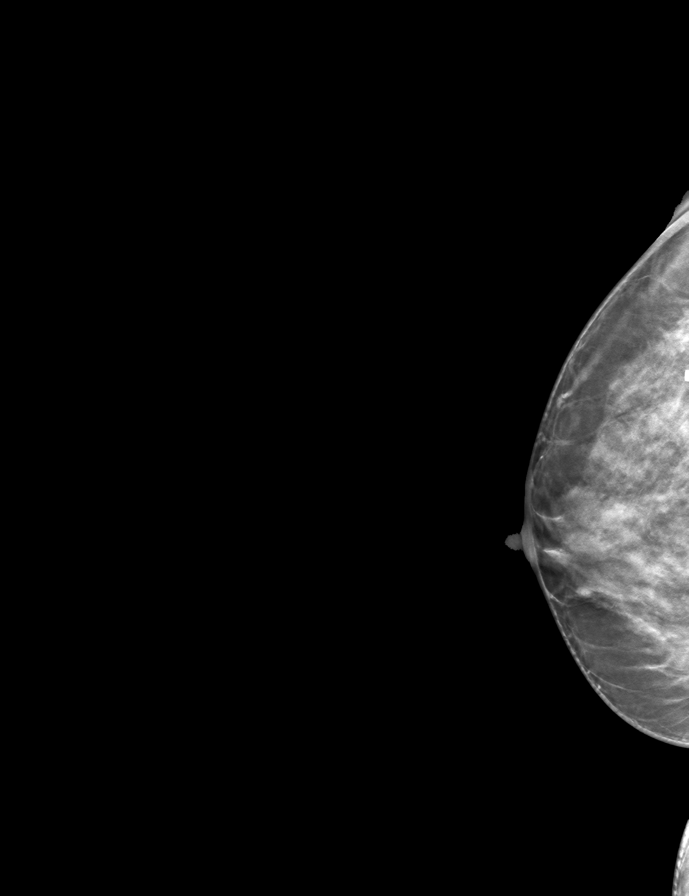
[frame 29/56]
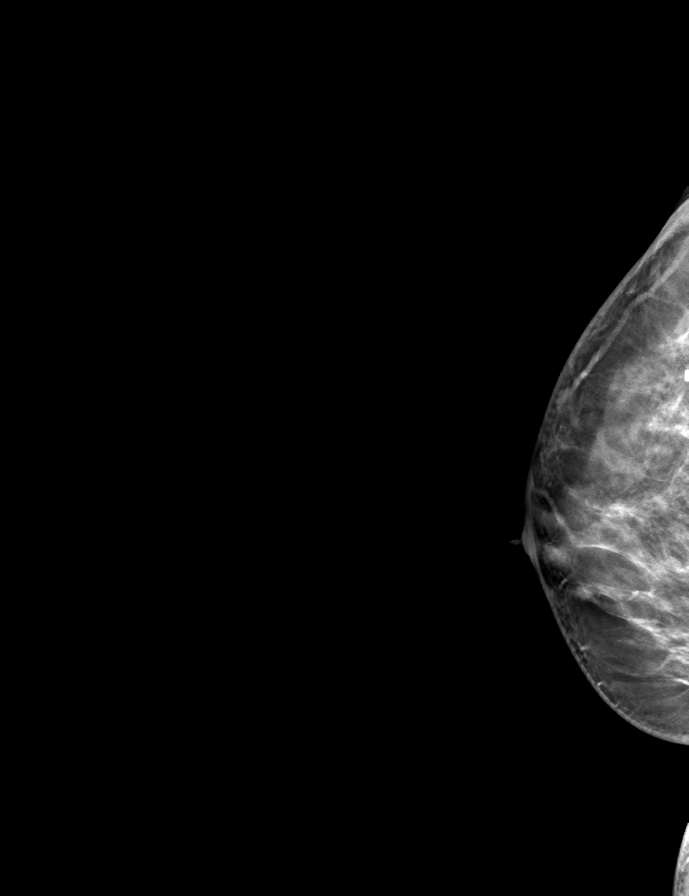

[L CC tomo · tomo slice 24/47.0]
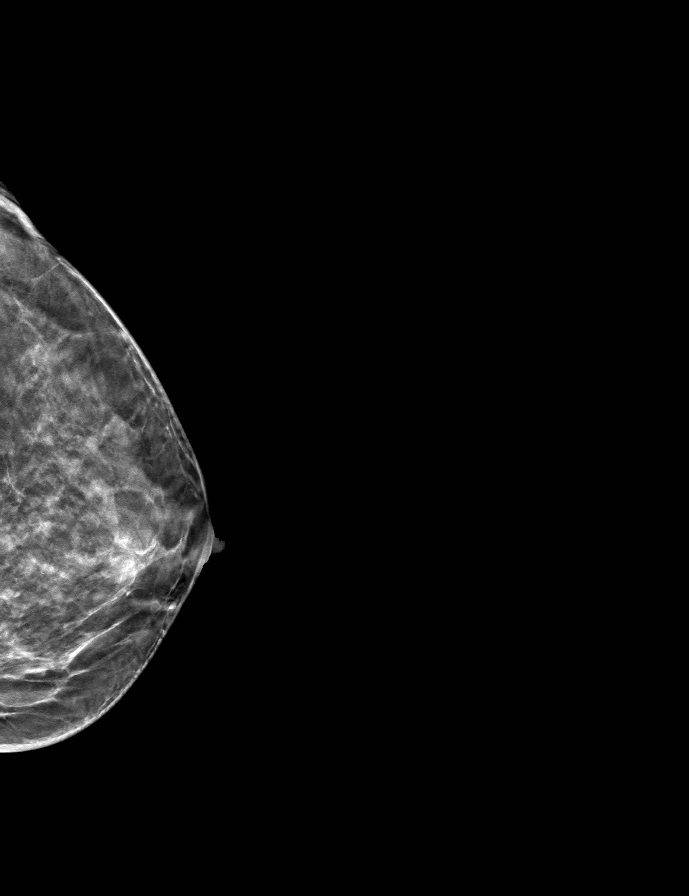

[L MLO tomo · tomo slice 21/40.0]
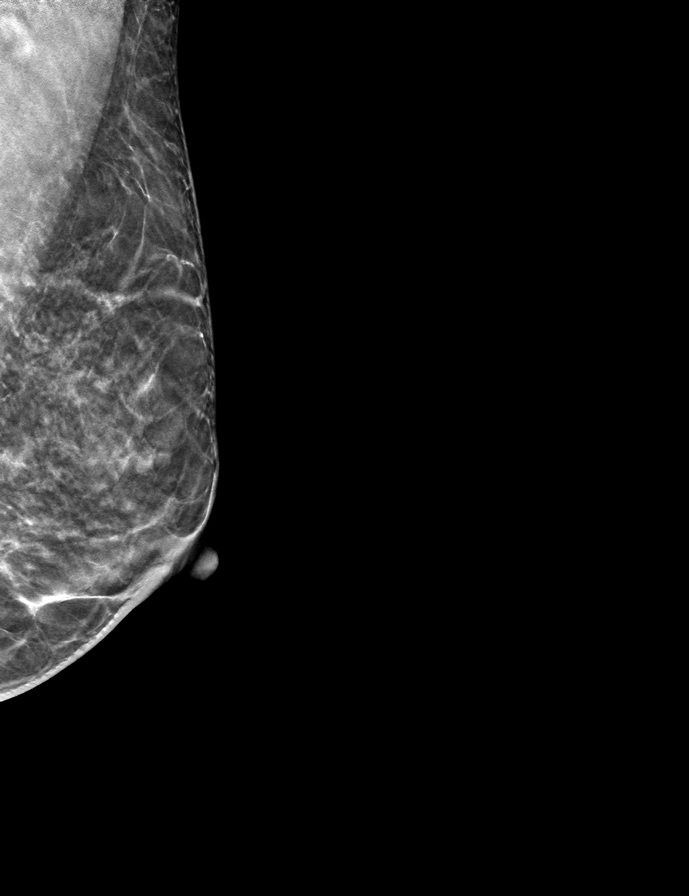

[R MLO tomo · tomo slice 25/49.0]
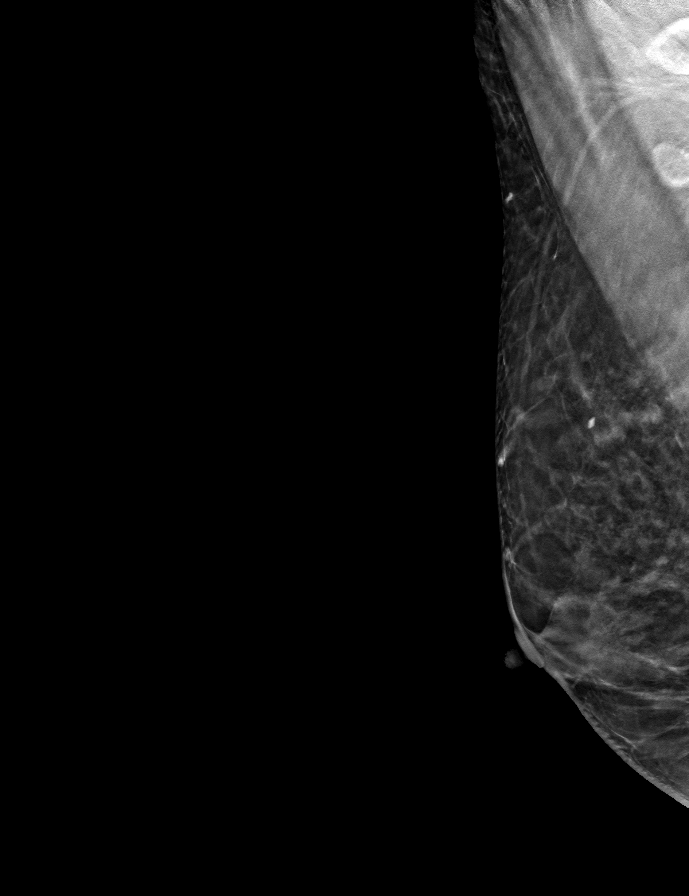

[9 of 24 positions shown; findings below may reference images not displayed]

ACR Breast Density Category c: The breast tissue is heterogeneously
dense, which may obscure small masses.
FINDINGS: There are no findings suspicious for malignancy.
IMPRESSION: No mammographic evidence of malignancy. A result letter of this
screening mammogram will be mailed directly to the patient.

RECOMMENDATION:
Screening mammogram in one year. (Code:[V2])

BI-RADS CATEGORY  1: Negative.

## 2021-02-04 ENCOUNTER — Other Ambulatory Visit (HOSPITAL_COMMUNITY): Payer: Self-pay

## 2021-04-17 ENCOUNTER — Other Ambulatory Visit (HOSPITAL_COMMUNITY): Payer: Self-pay

## 2021-04-30 ENCOUNTER — Other Ambulatory Visit (HOSPITAL_COMMUNITY): Payer: Self-pay

## 2021-07-17 ENCOUNTER — Other Ambulatory Visit (HOSPITAL_COMMUNITY): Payer: Self-pay

## 2021-07-18 ENCOUNTER — Other Ambulatory Visit (HOSPITAL_COMMUNITY): Payer: Self-pay

## 2021-07-24 ENCOUNTER — Encounter (HOSPITAL_COMMUNITY): Payer: Self-pay

## 2021-11-18 NOTE — Progress Notes (Unsigned)
51 y.o. G15P1011 Divorced Caucasian female here for annual exam.    Possible left breast mass. Noticed this one week ago.   No period since January, 2023.  Some hot flashes.   Declines STD screening.   Remodeling a bathroom at home.  Daughter went off to school.   PCP:   None.   Patient's last menstrual period was 03/16/2021 (approximate).           Sexually active: Yes.    The current method of family planning is none Exercising: Yes.     Works out 6-7 days/week Smoker:  no  Health Maintenance: Pap:  09-11-16 Neg:Neg HR HPV, 08-10-13 Neg:Neg HR HPV History of abnormal Pap:  no MMG:  02-03-21 Neg/BiRads1 Colonoscopy:  01-23-21 normal;10 years BMD:   n/a  Result  n/a TDaP:  up to date thru work Gardasil:   no HIV: 10-12-18 NR Hep C: 10-12-18 Neg Screening Labs:  today.   reports that she has never smoked. She has never used smokeless tobacco. She reports current alcohol use of about 2.0 standard drinks of alcohol per week. She reports that she does not use drugs.  Past Medical History:  Diagnosis Date   Allergic rhinitis, cause unspecified    Elevated liver enzymes 2022   Increased risk of breast cancer    Migraines    without aura    Past Surgical History:  Procedure Laterality Date   LIPOMA EXCISION  03/2010   right flank   RADIOACTIVE SEED GUIDED EXCISIONAL BREAST BIOPSY Left 05/21/2020   Procedure: LEFT BREAST MASS SEED GUIDED EXCISIONAL BIOPSY;  Surgeon: Emelia Loron, MD;  Location: [facility];  Service: General;  Laterality: Left;    Current Outpatient Medications  Medication Sig Dispense Refill   bimatoprost (LATISSE) 0.03 % ophthalmic solution PLACE ONE DROP VIA APPLICATOR EVENLY TO SKIN AT BASE of UPPER EYELASHES. USE NEW APPLICATOR EACH EYE.     cetirizine (ZYRTEC) 10 MG tablet Take 10 mg by mouth daily.     Cholecalciferol (VITAMIN D3) 125 MCG (5000 UT) CAPS Take by mouth.     fluticasone (FLONASE) 50 MCG/ACT nasal spray Place 2 sprays  into the nose as needed for rhinitis. 16 g 6   Multiple Vitamins-Minerals (MULTIVITAMIN PO) Take by mouth daily.     No current facility-administered medications for this visit.    Family History  Problem Relation Age of Onset   Colon polyps Mother    Arthritis Mother    Breast cancer Mother 8   Arthritis Maternal Grandmother    Breast cancer Maternal Grandmother 83   Prostate cancer Maternal Grandfather    Arthritis Maternal Grandfather    Stroke Other    Colon cancer Neg Hx    Esophageal cancer Neg Hx    Rectal cancer Neg Hx    Stomach cancer Neg Hx     Review of Systems  Genitourinary:        Left breast mass  All other systems reviewed and are negative.   Exam:   BP 120/82   Pulse (!) 129   Ht 5' 0.75" (1.543 m)   Wt 111 lb (50.3 kg)   LMP 03/16/2021 (Approximate)   SpO2 99%   BMI 21.15 kg/m     General appearance: alert, cooperative and appears stated age Head: normocephalic, without obvious abnormality, atraumatic Neck: no adenopathy, supple, symmetrical, trachea midline and thyroid normal to inspection and palpation Lungs: clear to auscultation bilaterally Breasts: right - normal appearance, no masses or  tenderness, No nipple retraction or dimpling, No nipple discharge or bleeding, No axillary adenopathy Left - normal appearance.   5 mm mass at 7 o'clock, 2 cm from the areola border. No tenderness, No nipple retraction or dimpling, No nipple discharge or bleeding, No axillary adenopathy Heart: regular rate and rhythm Abdomen: soft, non-tender; no masses, no organomegaly Extremities: extremities normal, atraumatic, no cyanosis or edema Skin: skin color, texture, turgor normal. No rashes or lesions Lymph nodes: cervical, supraclavicular, and axillary nodes normal. Neurologic: grossly normal  Pelvic: External genitalia:  no lesions              No abnormal inguinal nodes palpated.              Urethra:  normal appearing urethra with no masses, tenderness or  lesions              Bartholins and Skenes: normal                 Vagina: normal appearing vagina with normal color and discharge, no lesions              Cervix: no lesions              Pap taken: yes Bimanual Exam:  Uterus:  normal size, contour, position, consistency, mobility, non-tender              Adnexa: no mass, fullness, tenderness              Rectal exam: yes.  Confirms.              Anus:  normal sphincter tone, no lesions  Chaperone was present for exam:  Marchelle Folks, CMA  Assessment:   Well woman visit with gynecologic exam. Perimenopausal female. Hx low vit D.    Migraine without aura. Left breast mass.  Hx benign left breast biopsy.  Mother and maternal grandmother with breast cancer.  Patient has negative genetic testing.  Increased lifetime risk of breast cancer.  Family history of prostate cancer.  Plan: Dx left mammogram and left breast US. Yearly breast MRI. Self breast awareness reviewed. Pap and HR HPV as above. Guidelines for Calcium, Vitamin D, regular exercise program including cardiovascular and weight bearing exercise. Routine labs today.  Follow up annually and prn.   After visit summary provided.

## 2021-11-19 ENCOUNTER — Encounter: Payer: Self-pay | Admitting: Obstetrics and Gynecology

## 2021-11-19 ENCOUNTER — Other Ambulatory Visit (HOSPITAL_COMMUNITY)
Admission: RE | Admit: 2021-11-19 | Discharge: 2021-11-19 | Disposition: A | Discharge status: Home / Self Care | Payer: 59 | Source: Ambulatory Visit | Attending: Obstetrics and Gynecology | Admitting: Obstetrics and Gynecology

## 2021-11-19 ENCOUNTER — Telehealth: Payer: Self-pay | Admitting: Obstetrics and Gynecology

## 2021-11-19 ENCOUNTER — Ambulatory Visit (INDEPENDENT_AMBULATORY_CARE_PROVIDER_SITE_OTHER): Payer: 59 | Admitting: Obstetrics and Gynecology

## 2021-11-19 VITALS — BP 120/82 | HR 129 | Ht 60.75 in | Wt 111.0 lb

## 2021-11-19 DIAGNOSIS — N6324 Unspecified lump in the left breast, lower inner quadrant: Secondary | ICD-10-CM | POA: Diagnosis not present

## 2021-11-19 DIAGNOSIS — Z124 Encounter for screening for malignant neoplasm of cervix: Secondary | ICD-10-CM | POA: Insufficient documentation

## 2021-11-19 DIAGNOSIS — Z01419 Encounter for gynecological examination (general) (routine) without abnormal findings: Secondary | ICD-10-CM | POA: Diagnosis not present

## 2021-11-19 DIAGNOSIS — Z0000 Encounter for general adult medical examination without abnormal findings: Secondary | ICD-10-CM | POA: Diagnosis not present

## 2021-11-19 NOTE — Telephone Encounter (Signed)
Patient scheduled at the breast center on 12/02/21 @ 8:50am arrive 15 minutes for check in.

## 2021-11-19 NOTE — Telephone Encounter (Signed)
Please schedule a dx left mammogram and left breast US at [facility].   My patient has a 5 mm mass at 7 o'clock, 2 cm from the areola border.

## 2021-11-19 NOTE — Patient Instructions (Signed)

## 2021-11-20 LAB — COMPREHENSIVE METABOLIC PANEL
AG Ratio: 1.7 (calc) (ref 1.0–2.5)
ALT: 40 U/L — ABNORMAL HIGH (ref 6–29)
AST: 32 U/L (ref 10–35)
Albumin: 4.7 g/dL (ref 3.6–5.1)
Alkaline phosphatase (APISO): 46 U/L (ref 37–153)
BUN: 15 mg/dL (ref 7–25)
CO2: 28 mmol/L (ref 20–32)
Calcium: 10.2 mg/dL (ref 8.6–10.4)
Chloride: 104 mmol/L (ref 98–110)
Creat: 0.84 mg/dL (ref 0.50–1.03)
Globulin: 2.7 g/dL (calc) (ref 1.9–3.7)
Glucose, Bld: 97 mg/dL (ref 65–99)
Potassium: 4.7 mmol/L (ref 3.5–5.3)
Sodium: 143 mmol/L (ref 135–146)
Total Bilirubin: 0.8 mg/dL (ref 0.2–1.2)
Total Protein: 7.4 g/dL (ref 6.1–8.1)

## 2021-11-20 LAB — LIPID PANEL
Cholesterol: 231 mg/dL — ABNORMAL HIGH (ref <200)
HDL: 95 mg/dL (ref >=50)
LDL Cholesterol (Calc): 116 mg/dL (calc) — ABNORMAL HIGH
Non-HDL Cholesterol (Calc): 136 mg/dL (calc) — ABNORMAL HIGH (ref <130)
Total CHOL/HDL Ratio: 2.4 (calc) (ref <5.0)
Triglycerides: 94 mg/dL (ref <150)

## 2021-11-20 LAB — CBC
HCT: 44.1 % (ref 35.0–45.0)
Hemoglobin: 14.6 g/dL (ref 11.7–15.5)
MCH: 30.5 pg (ref 27.0–33.0)
MCHC: 33.1 g/dL (ref 32.0–36.0)
MCV: 92.1 fL (ref 80.0–100.0)
MPV: 9.9 fL (ref 7.5–12.5)
Platelets: 269 10*3/uL (ref 140–400)
RBC: 4.79 10*6/uL (ref 3.80–5.10)
RDW: 11.4 % (ref 11.0–15.0)
WBC: 4.2 10*3/uL (ref 3.8–10.8)

## 2021-11-20 LAB — TSH: TSH: 1.99 mIU/L

## 2021-11-20 LAB — VITAMIN D 25 HYDROXY (VIT D DEFICIENCY, FRACTURES): Vit D, 25-Hydroxy: 47 ng/mL (ref 30–100)

## 2021-11-21 LAB — CYTOLOGY - PAP
Comment: NEGATIVE
Diagnosis: NEGATIVE
High risk HPV: NEGATIVE

## 2021-12-02 ENCOUNTER — Ambulatory Visit
Admission: RE | Admit: 2021-12-02 | Discharge: 2021-12-02 | Disposition: A | Discharge status: Home / Self Care | Payer: 59 | Source: Ambulatory Visit | Attending: Obstetrics and Gynecology | Admitting: Obstetrics and Gynecology

## 2021-12-02 DIAGNOSIS — N6489 Other specified disorders of breast: Secondary | ICD-10-CM | POA: Diagnosis not present

## 2021-12-02 DIAGNOSIS — R922 Inconclusive mammogram: Secondary | ICD-10-CM | POA: Diagnosis not present

## 2021-12-02 DIAGNOSIS — N6324 Unspecified lump in the left breast, lower inner quadrant: Secondary | ICD-10-CM

## 2021-12-23 ENCOUNTER — Other Ambulatory Visit: Payer: Self-pay | Admitting: Obstetrics and Gynecology

## 2021-12-23 DIAGNOSIS — Z1231 Encounter for screening mammogram for malignant neoplasm of breast: Secondary | ICD-10-CM

## 2022-01-05 ENCOUNTER — Telehealth: Payer: Self-pay

## 2022-01-05 NOTE — Telephone Encounter (Signed)
Alyssa Cobbs, MD  P [facility] Triage Please contact patient in follow up to the message I sent to her regarding proceeding with a breast MRI if desired.    I spoke with patient and she prefers to schedule May or June 2024. Dr. Quincy Simmonds informed in result note.  Recall placed for patient.

## 2022-01-16 ENCOUNTER — Other Ambulatory Visit (HOSPITAL_BASED_OUTPATIENT_CLINIC_OR_DEPARTMENT_OTHER): Payer: Self-pay

## 2022-01-16 MED ORDER — COMIRNATY 30 MCG/0.3ML IM SUSY
PREFILLED_SYRINGE | INTRAMUSCULAR | 0 refills | Status: AC
  Filled 2022-01-16: qty 0.3, 1d supply, fill #0

## 2022-01-21 ENCOUNTER — Other Ambulatory Visit (HOSPITAL_COMMUNITY): Payer: Self-pay

## 2022-01-21 DIAGNOSIS — H5213 Myopia, bilateral: Secondary | ICD-10-CM | POA: Diagnosis not present

## 2022-01-21 MED ORDER — BEPOTASTINE BESILATE 1.5 % OP SOLN
1.0000 [drp] | Freq: Every day | OPHTHALMIC | 0 refills | Status: AC
  Filled 2022-01-21: qty 5, 38d supply, fill #0

## 2022-01-22 ENCOUNTER — Other Ambulatory Visit (HOSPITAL_BASED_OUTPATIENT_CLINIC_OR_DEPARTMENT_OTHER): Payer: Self-pay

## 2022-01-28 ENCOUNTER — Other Ambulatory Visit (HOSPITAL_COMMUNITY): Payer: Self-pay

## 2022-02-04 ENCOUNTER — Ambulatory Visit
Admission: RE | Admit: 2022-02-04 | Discharge: 2022-02-04 | Disposition: A | Discharge status: Home / Self Care | Payer: 59 | Source: Ambulatory Visit | Attending: Obstetrics and Gynecology | Admitting: Obstetrics and Gynecology

## 2022-02-04 DIAGNOSIS — Z1231 Encounter for screening mammogram for malignant neoplasm of breast: Secondary | ICD-10-CM | POA: Diagnosis not present

## 2022-02-06 ENCOUNTER — Other Ambulatory Visit (HOSPITAL_COMMUNITY): Payer: Self-pay

## 2022-02-13 ENCOUNTER — Other Ambulatory Visit (HOSPITAL_BASED_OUTPATIENT_CLINIC_OR_DEPARTMENT_OTHER): Payer: Self-pay

## 2022-02-13 MED ORDER — SHINGRIX 50 MCG/0.5ML IM SUSR
INTRAMUSCULAR | 1 refills | Status: AC
  Filled 2022-02-13: qty 0.5, 1d supply, fill #0
  Filled 2022-05-22: qty 0.5, 1d supply, fill #1

## 2022-05-22 ENCOUNTER — Other Ambulatory Visit (HOSPITAL_BASED_OUTPATIENT_CLINIC_OR_DEPARTMENT_OTHER): Payer: Self-pay

## 2022-06-01 ENCOUNTER — Other Ambulatory Visit (HOSPITAL_BASED_OUTPATIENT_CLINIC_OR_DEPARTMENT_OTHER): Payer: Self-pay

## 2022-06-02 ENCOUNTER — Other Ambulatory Visit: Payer: Self-pay

## 2022-06-02 DIAGNOSIS — Z9189 Other specified personal risk factors, not elsewhere classified: Secondary | ICD-10-CM

## 2022-06-02 NOTE — Telephone Encounter (Signed)
Order placed with scheduling comment to schedule May or June. Recall has been placed. Patient aware they will call to schedule.

## 2022-06-15 ENCOUNTER — Other Ambulatory Visit (HOSPITAL_BASED_OUTPATIENT_CLINIC_OR_DEPARTMENT_OTHER): Payer: Self-pay

## 2022-09-11 ENCOUNTER — Other Ambulatory Visit: Payer: Commercial Managed Care - PPO

## 2022-09-21 ENCOUNTER — Ambulatory Visit
Admission: RE | Admit: 2022-09-21 | Discharge: 2022-09-21 | Disposition: A | Discharge status: Home / Self Care | Payer: 59 | Source: Ambulatory Visit | Attending: Obstetrics and Gynecology | Admitting: Obstetrics and Gynecology

## 2022-09-21 DIAGNOSIS — Z1239 Encounter for other screening for malignant neoplasm of breast: Secondary | ICD-10-CM | POA: Diagnosis not present

## 2022-09-21 DIAGNOSIS — Z9189 Other specified personal risk factors, not elsewhere classified: Secondary | ICD-10-CM

## 2022-09-21 DIAGNOSIS — Z803 Family history of malignant neoplasm of breast: Secondary | ICD-10-CM | POA: Diagnosis not present

## 2022-09-21 MED ORDER — GADOPICLENOL 0.5 MMOL/ML IV SOLN
6.0000 mL | Freq: Once | INTRAVENOUS | Status: AC | PRN
  Administered 2022-09-21: 6 mL via INTRAVENOUS

## 2022-11-11 NOTE — Progress Notes (Signed)
52 y.o. G45P1011 Divorced Caucasian female here for annual exam.  LMP Jan 2023.  No bleeding since.   FSH 111.1 on 11/13/20. Some hot flashes, that wax and wane. Interrupted sleep.  Weight gain of 10 pounds.  Notes vaginal dryness.   Declines STD screening.  Using condoms.   Some anxiety. Stress with her ex-husband.   Daughter is back at college.   PCP:   None  Patient's last menstrual period was 03/16/2021 (approximate).           Sexually active: Yes.    The current method of family planning is post menopausal status???.    Exercising: Yes.     Weight training, walking, yoga Smoker:  no  Health Maintenance: Pap:  11/19/21 neg: HR HPV neg, 09/11/16 neg: HR HPV neg History of abnormal Pap:  no MMG:  02/04/22 Breast Density Cat C, BI-RADS CAT 1 neg.  MR breast 09/21/22 - BI-RADS 1. Colonoscopy:  01/23/21 - due in 2032. BMD:   n/a  Result  n/a TDaP:  unsure.  She thinks it is due.  Gardasil:   no HIV: 10/12/18 NR Hep C: 10/12/18 neg Screening Labs:  today   reports that she has never smoked. She has never used smokeless tobacco. She reports current alcohol use of about 2.0 standard drinks of alcohol per week. She reports that she does not use drugs.  Past Medical History:  Diagnosis Date   Allergic rhinitis, cause unspecified    Elevated liver enzymes 2022   Increased risk of breast cancer    Migraines    without aura    Past Surgical History:  Procedure Laterality Date   LIPOMA EXCISION  03/2010   right flank   RADIOACTIVE SEED GUIDED EXCISIONAL BREAST BIOPSY Left 05/21/2020   Procedure: LEFT BREAST MASS SEED GUIDED EXCISIONAL BIOPSY;  Surgeon: Emelia Loron, MD;  Location: [facility];  Service: General;  Laterality: Left;    Current Outpatient Medications  Medication Sig Dispense Refill   Bepotastine Besilate (BEPREVE) 1.5 % SOLN Place 1 drop into both eyes daily. 5 mL 0   bimatoprost (LATISSE) 0.03 % ophthalmic solution PLACE ONE DROP VIA  APPLICATOR EVENLY TO SKIN AT BASE of UPPER EYELASHES. USE NEW APPLICATOR EACH EYE.     cetirizine (ZYRTEC) 10 MG tablet Take 10 mg by mouth daily.     Cholecalciferol (VITAMIN D3) 125 MCG (5000 UT) CAPS Take by mouth.     COVID-19 mRNA vaccine 2023-2024 (COMIRNATY) syringe Inject into the muscle. 0.3 mL 0   fluticasone (FLONASE) 50 MCG/ACT nasal spray Place 2 sprays into the nose as needed for rhinitis. 16 g 6   MAGNESIUM GLYCINATE PO Take by mouth.     Multiple Vitamins-Minerals (MULTIVITAMIN PO) Take by mouth daily.     Zoster Vaccine Adjuvanted [facility]) injection Inject into the muscle. 0.5 mL 1   No current facility-administered medications for this visit.    Family History  Problem Relation Age of Onset   Colon polyps Mother    Arthritis Mother    Breast cancer Mother 73   Arthritis Maternal Grandmother    Breast cancer Maternal Grandmother 36   Prostate cancer Maternal Grandfather    Arthritis Maternal Grandfather    Stroke Other    Colon cancer Neg Hx    Esophageal cancer Neg Hx    Rectal cancer Neg Hx    Stomach cancer Neg Hx     Review of Systems  All other systems reviewed and are negative.  Exam:   BP 124/76 (BP Location: Right Arm, Patient Position: Sitting, Cuff Size: Normal)   Pulse 94   Ht 5' 1.5" (1.562 m)   LMP 03/16/2021 (Approximate) Comment: Has not had a period in 1.5 years  SpO2 98%   BMI 20.63 kg/m     General appearance: alert, cooperative and appears stated age Head: normocephalic, without obvious abnormality, atraumatic Neck: no adenopathy, supple, symmetrical, trachea midline and thyroid normal to inspection and palpation Lungs: clear to auscultation bilaterally Breasts: normal appearance, no masses or tenderness, No nipple retraction or dimpling, No nipple discharge or bleeding, No axillary adenopathy Heart: regular rate and rhythm Abdomen: soft, non-tender; no masses, no organomegaly Extremities: extremities normal, atraumatic, no  cyanosis or edema Skin: skin color, texture, turgor normal. No rashes or lesions Lymph nodes: cervical, supraclavicular, and axillary nodes normal. Neurologic: grossly normal  Pelvic: External genitalia:  no lesions              No abnormal inguinal nodes palpated.              Urethra:  normal appearing urethra with no masses, tenderness or lesions              Bartholins and Skenes: normal                 Vagina: normal appearing vagina with normal color and discharge, no lesions              Cervix: no lesions              Pap taken: no Bimanual Exam:  Uterus:  normal size, contour, position, consistency, mobility, non-tender              Adnexa: no mass, fullness, tenderness              Rectal exam: yes.  Confirms.              Anus:  normal sphincter tone, no lesions  Chaperone was present for exam:  Rosette Reveal, CMA  Assessment:   Well woman visit with gynecologic exam. Anxiety. Menopausal symptoms.  Vaginal atrophy.  Migraine without aura. Hx benign left breast biopsy.  Mother and maternal grandmother with breast cancer.  Patient has negative genetic testing.  Increased lifetime risk of breast cancer.  Family history of prostate cancer.  Plan: Mammogram screening discussed. Self breast awareness reviewed. Pap and HR HPV 2028. Guidelines for Calcium, Vitamin D, regular exercise program including cardiovascular and weight bearing exercise. We reviewed SSRI/SNRI/Gabapentin/[place].  No Rx given.   Vaginal estrogens, water based lubricants, cooking oils [place] and vit E discussed for tx atrophy.  No Rx given. Wellbutrin XL 150 mg.  #90, RF none.  Potential side effects discussed.  She will let me know how she is doing before receiving any further refills.  Routine labs.  TDap. Follow up annually and prn.

## 2022-11-25 ENCOUNTER — Other Ambulatory Visit (HOSPITAL_COMMUNITY): Payer: Self-pay

## 2022-11-25 ENCOUNTER — Encounter: Payer: Self-pay | Admitting: Obstetrics and Gynecology

## 2022-11-25 ENCOUNTER — Ambulatory Visit (INDEPENDENT_AMBULATORY_CARE_PROVIDER_SITE_OTHER): Payer: 59 | Admitting: Obstetrics and Gynecology

## 2022-11-25 VITALS — BP 124/76 | HR 94 | Ht 61.5 in

## 2022-11-25 DIAGNOSIS — Z01419 Encounter for gynecological examination (general) (routine) without abnormal findings: Secondary | ICD-10-CM | POA: Diagnosis not present

## 2022-11-25 DIAGNOSIS — Z0000 Encounter for general adult medical examination without abnormal findings: Secondary | ICD-10-CM

## 2022-11-25 DIAGNOSIS — F419 Anxiety disorder, unspecified: Secondary | ICD-10-CM

## 2022-11-25 DIAGNOSIS — Z23 Encounter for immunization: Secondary | ICD-10-CM

## 2022-11-25 MED ORDER — BUPROPION HCL ER (XL) 150 MG PO TB24
150.0000 mg | ORAL_TABLET | Freq: Every day | ORAL | 0 refills | Status: DC
  Filled 2022-11-25: qty 90, 90d supply, fill #0

## 2022-11-25 NOTE — Patient Instructions (Signed)

## 2022-11-26 LAB — CBC
HCT: 42 % (ref 35.0–45.0)
Hemoglobin: 13.9 g/dL (ref 11.7–15.5)
MCH: 29.6 pg (ref 27.0–33.0)
MCHC: 33.1 g/dL (ref 32.0–36.0)
MCV: 89.6 fL (ref 80.0–100.0)
MPV: 10.3 fL (ref 7.5–12.5)
Platelets: 284 10*3/uL (ref 140–400)
RBC: 4.69 10*6/uL (ref 3.80–5.10)
RDW: 12.5 % (ref 11.0–15.0)
WBC: 3.7 10*3/uL — ABNORMAL LOW (ref 3.8–10.8)

## 2022-11-26 LAB — LIPID PANEL
Cholesterol: 272 mg/dL — ABNORMAL HIGH (ref <200)
HDL: 103 mg/dL (ref >=50)
LDL Cholesterol (Calc): 150 mg/dL — ABNORMAL HIGH
Non-HDL Cholesterol (Calc): 169 mg/dL — ABNORMAL HIGH (ref <130)
Total CHOL/HDL Ratio: 2.6 (calc) (ref <5.0)
Triglycerides: 82 mg/dL (ref <150)

## 2022-11-26 LAB — COMPREHENSIVE METABOLIC PANEL
AG Ratio: 1.8 (calc) (ref 1.0–2.5)
ALT: 15 U/L (ref 6–29)
AST: 19 U/L (ref 10–35)
Albumin: 5 g/dL (ref 3.6–5.1)
Alkaline phosphatase (APISO): 66 U/L (ref 37–153)
BUN: 14 mg/dL (ref 7–25)
CO2: 27 mmol/L (ref 20–32)
Calcium: 10 mg/dL (ref 8.6–10.4)
Chloride: 101 mmol/L (ref 98–110)
Creat: 0.79 mg/dL (ref 0.50–1.03)
Globulin: 2.8 g/dL (ref 1.9–3.7)
Glucose, Bld: 96 mg/dL (ref 65–99)
Potassium: 4.5 mmol/L (ref 3.5–5.3)
Sodium: 140 mmol/L (ref 135–146)
Total Bilirubin: 1 mg/dL (ref 0.2–1.2)
Total Protein: 7.8 g/dL (ref 6.1–8.1)

## 2022-11-26 LAB — TSH: TSH: 1.97 m[IU]/L

## 2022-11-27 ENCOUNTER — Other Ambulatory Visit: Payer: Self-pay | Admitting: Obstetrics and Gynecology

## 2022-11-27 DIAGNOSIS — D72819 Decreased white blood cell count, unspecified: Secondary | ICD-10-CM

## 2022-11-27 DIAGNOSIS — E7800 Pure hypercholesterolemia, unspecified: Secondary | ICD-10-CM

## 2022-12-25 ENCOUNTER — Other Ambulatory Visit: Payer: Self-pay | Admitting: Obstetrics and Gynecology

## 2022-12-25 DIAGNOSIS — Z1231 Encounter for screening mammogram for malignant neoplasm of breast: Secondary | ICD-10-CM

## 2023-01-28 ENCOUNTER — Other Ambulatory Visit: Payer: Self-pay | Admitting: Obstetrics and Gynecology

## 2023-01-28 NOTE — Telephone Encounter (Signed)
Medication refill request: Wellbutrin 150  Last AEX:  11/25/22 Next AEX: 11/29/23  Last MMG (if hormonal medication request): n/a Refill authorized: #90 with 2 refills pended

## 2023-01-29 ENCOUNTER — Other Ambulatory Visit (HOSPITAL_COMMUNITY): Payer: Self-pay

## 2023-01-29 MED ORDER — BUPROPION HCL ER (XL) 150 MG PO TB24
150.0000 mg | ORAL_TABLET | Freq: Every day | ORAL | 3 refills | Status: AC
  Filled 2023-01-29: qty 90, 90d supply, fill #0
  Filled 2023-05-20: qty 90, 90d supply, fill #1

## 2023-02-08 ENCOUNTER — Ambulatory Visit
Admission: RE | Admit: 2023-02-08 | Discharge: 2023-02-08 | Disposition: A | Discharge status: Home / Self Care | Payer: 59 | Source: Ambulatory Visit | Attending: Obstetrics and Gynecology | Admitting: Obstetrics and Gynecology

## 2023-02-08 DIAGNOSIS — Z1231 Encounter for screening mammogram for malignant neoplasm of breast: Secondary | ICD-10-CM

## 2023-03-24 ENCOUNTER — Telehealth: Payer: Self-pay | Admitting: Obstetrics and Gynecology

## 2023-03-24 NOTE — Telephone Encounter (Signed)
 Please contact patient and remind her she is due for a recheck of her cholesterol which was high and her WBC which was low.   She came up in my reminder box in Epic.   Please let me know if she has had another provider do this already.   Happy New Year to Alyssa Livingston.

## 2023-03-24 NOTE — Telephone Encounter (Signed)
 Left message to call [facility] Triage at [phone], option 4.

## 2023-04-13 ENCOUNTER — Other Ambulatory Visit: Payer: 59

## 2023-04-13 DIAGNOSIS — E7800 Pure hypercholesterolemia, unspecified: Secondary | ICD-10-CM | POA: Diagnosis not present

## 2023-04-13 DIAGNOSIS — D72819 Decreased white blood cell count, unspecified: Secondary | ICD-10-CM | POA: Diagnosis not present

## 2023-04-14 ENCOUNTER — Encounter: Payer: Self-pay | Admitting: Obstetrics and Gynecology

## 2023-04-14 LAB — CBC WITH DIFFERENTIAL/PLATELET
Absolute Lymphocytes: 1857 {cells}/uL (ref 850–3900)
Absolute Monocytes: 397 {cells}/uL (ref 200–950)
Basophils Absolute: 49 {cells}/uL (ref 0–200)
Basophils Relative: 1 %
Eosinophils Absolute: 118 {cells}/uL (ref 15–500)
Eosinophils Relative: 2.4 %
HCT: 41 % (ref 35.0–45.0)
Hemoglobin: 13.9 g/dL (ref 11.7–15.5)
MCH: 30.2 pg (ref 27.0–33.0)
MCHC: 33.9 g/dL (ref 32.0–36.0)
MCV: 88.9 fL (ref 80.0–100.0)
MPV: 9.9 fL (ref 7.5–12.5)
Monocytes Relative: 8.1 %
Neutro Abs: 2479 {cells}/uL (ref 1500–7800)
Neutrophils Relative %: 50.6 %
Platelets: 286 10*3/uL (ref 140–400)
RBC: 4.61 10*6/uL (ref 3.80–5.10)
RDW: 12.3 % (ref 11.0–15.0)
Total Lymphocyte: 37.9 %
WBC: 4.9 10*3/uL (ref 3.8–10.8)

## 2023-04-14 LAB — LIPID PANEL
Cholesterol: 269 mg/dL — ABNORMAL HIGH (ref <200)
HDL: 100 mg/dL (ref >=50)
LDL Cholesterol (Calc): 150 mg/dL — ABNORMAL HIGH
Non-HDL Cholesterol (Calc): 169 mg/dL — ABNORMAL HIGH (ref <130)
Total CHOL/HDL Ratio: 2.7 (calc) (ref <5.0)
Triglycerides: 87 mg/dL (ref <150)

## 2023-04-14 NOTE — Telephone Encounter (Signed)
Labs completed on 04/13/23.   Encounter closed.

## 2023-06-10 ENCOUNTER — Encounter: Payer: Self-pay | Admitting: Obstetrics and Gynecology

## 2023-06-14 ENCOUNTER — Encounter: Payer: Self-pay | Admitting: Obstetrics and Gynecology

## 2023-06-14 ENCOUNTER — Other Ambulatory Visit: Payer: Self-pay | Admitting: Obstetrics and Gynecology

## 2023-06-14 DIAGNOSIS — Z9189 Other specified personal risk factors, not elsewhere classified: Secondary | ICD-10-CM

## 2023-06-14 NOTE — Progress Notes (Signed)
 Order placed for breast MRI at [facility] on [facility] due to increased lifetime risk of breast caner 25.4%.

## 2023-06-24 NOTE — Telephone Encounter (Signed)
 MyChart message viewed.   Encounter closed.

## 2023-07-12 ENCOUNTER — Encounter: Payer: Self-pay | Admitting: Obstetrics and Gynecology

## 2023-08-01 ENCOUNTER — Ambulatory Visit
Admission: RE | Admit: 2023-08-01 | Discharge: 2023-08-01 | Disposition: A | Discharge status: Home / Self Care | Source: Ambulatory Visit | Attending: Obstetrics and Gynecology | Admitting: Obstetrics and Gynecology

## 2023-08-01 DIAGNOSIS — Z9189 Other specified personal risk factors, not elsewhere classified: Secondary | ICD-10-CM

## 2023-08-01 DIAGNOSIS — Z803 Family history of malignant neoplasm of breast: Secondary | ICD-10-CM | POA: Diagnosis not present

## 2023-08-01 DIAGNOSIS — Z1239 Encounter for other screening for malignant neoplasm of breast: Secondary | ICD-10-CM | POA: Diagnosis not present

## 2023-08-01 MED ORDER — GADOPICLENOL 0.5 MMOL/ML IV SOLN
5.0000 mL | Freq: Once | INTRAVENOUS | Status: AC | PRN
  Administered 2023-08-01: 5 mL via INTRAVENOUS

## 2023-08-02 ENCOUNTER — Ambulatory Visit: Payer: Self-pay | Admitting: Obstetrics and Gynecology

## 2023-08-10 ENCOUNTER — Other Ambulatory Visit (HOSPITAL_COMMUNITY): Payer: Self-pay

## 2023-08-10 MED ORDER — PREDNISONE 10 MG PO TABS
ORAL_TABLET | ORAL | 0 refills | Status: DC
  Filled 2023-08-10: qty 35, 15d supply, fill #0

## 2023-11-22 NOTE — Progress Notes (Signed)
 53 y.o. G17P1011 Divorced Caucasian female here for annual exam. States she has not had a period in over 1 year. Having menopause symptoms, not sleeping well & weight gain are main concerns.     Hot flashes are more mild. Takes melatonin at sleep sometimes.    Less energy and motivation.  Work is stressful.  Wellbutrin  XL increased her heart rate in the past, so she stopped this.   No bleeding or spotting.    She monitors her BP at home and it is normal.   PCP: Pcp, No   No LMP recorded.           Sexually active: Yes.    The current method of family planning is none.    Menopausal hormone therapy:  n/a Exercising: Yes.    Hit workout, yoga, weight training, walking  Smoker:  no  OB History  Gravida Para Term Preterm AB Living  2 1 1  1 1   SAB IAB Ectopic Multiple Live Births      1    # Outcome Date GA Lbr Len/2nd Weight Sex Type Anes PTL Lv  2 AB           1 Term     F Vag-Spont   LIV     HEALTH MAINTENANCE: Last 2 paps:  11/19/21 neg HR HPV neg, 09/11/16 neg HPV neg History of abnormal Pap or positive HPV:  no Mammogram:   02/08/23 Breast Density Cat C, BIRADS Cat 1 neg.  MRI 09/02/23 - BI-RADS 1 Colonoscopy: 01/23/21 - due in 2032. Bone Density:  n/a  Result  n/a   Immunization History  Administered Date(s) Administered   Pfizer Covid-19 Vaccine Bivalent Booster 43yrs & up 12/19/2020   Pfizer(Comirnaty )Fall Seasonal Vaccine 12 years and older 01/16/2022   Tdap 06/09/2011, 11/25/2022   Zoster Recombinant(Shingrix ) 02/13/2022, 06/01/2022      reports that she has never smoked. She has never used smokeless tobacco. She reports current alcohol use of about 2.0 standard drinks of alcohol per week. She reports that she does not use drugs.  Past Medical History:  Diagnosis Date   Allergic rhinitis, cause unspecified    Elevated liver enzymes 2022   Increased risk of breast cancer    25.4% lifetime risk.  See oncology note in Epic 11/27/19.   Migraines    without  aura    Past Surgical History:  Procedure Laterality Date   LIPOMA EXCISION  03/16/2010   right flank   RADIOACTIVE SEED GUIDED EXCISIONAL BREAST BIOPSY Left 05/21/2020   Procedure: LEFT BREAST MASS SEED GUIDED EXCISIONAL BIOPSY;  Surgeon: Ebbie Cough, MD;  Location: [facility];  Service: General;  Laterality: Left;    Current Outpatient Medications  Medication Sig Dispense Refill   Bepotastine  Besilate (BEPREVE ) 1.5 % SOLN Place 1 drop into both eyes daily. 5 mL 0   bimatoprost (LATISSE) 0.03 % ophthalmic solution PLACE ONE DROP VIA APPLICATOR EVENLY TO SKIN AT BASE of UPPER EYELASHES. USE NEW APPLICATOR EACH EYE.     cetirizine (ZYRTEC) 10 MG tablet Take 10 mg by mouth daily.     Cholecalciferol (VITAMIN D3) 125 MCG (5000 UT) CAPS Take by mouth.     fluticasone  (FLONASE ) 50 MCG/ACT nasal spray Place 2 sprays into the nose as needed for rhinitis. 16 g 6   MAGNESIUM GLYCINATE PO Take by mouth.     Multiple Vitamins-Minerals (MULTIVITAMIN PO) Take by mouth daily.     buPROPion  (WELLBUTRIN  XL) 150 MG  24 hr tablet Take 1 tablet (150 mg total) by mouth daily. (Patient not taking: Reported on 11/29/2023) 90 tablet 3   COVID-19 mRNA vaccine 2023-2024 (COMIRNATY ) syringe Inject into the muscle. (Patient not taking: Reported on 11/29/2023) 0.3 mL 0   Zoster Vaccine Adjuvanted (SHINGRIX ) injection Inject into the muscle. (Patient not taking: Reported on 11/29/2023) 0.5 mL 1   No current facility-administered medications for this visit.    ALLERGIES: Patient has no known allergies.  Family History  Problem Relation Age of Onset   Colon polyps Mother    Arthritis Mother    Breast cancer Mother 66   Arthritis Maternal Grandmother    Breast cancer Maternal Grandmother 45   Prostate cancer Maternal Grandfather    Arthritis Maternal Grandfather    Stroke Other    Colon cancer Neg Hx    Esophageal cancer Neg Hx    Rectal cancer Neg Hx    Stomach cancer Neg Hx      Review of Systems  All other systems reviewed and are negative.   PHYSICAL EXAM:  BP (!) 152/84 (BP Location: Right Arm, Patient Position: Sitting)   Pulse (!) 119   Ht 5' 2 (1.575 m)   Wt 128 lb (58.1 kg)   SpO2 98%   BMI 23.41 kg/m     General appearance: alert, cooperative and appears stated age Head: normocephalic, without obvious abnormality, atraumatic Neck: no adenopathy, supple, symmetrical, trachea midline and thyroid  normal to inspection and palpation Lungs: clear to auscultation bilaterally Breasts: normal appearance, no masses or tenderness, No nipple retraction or dimpling, No nipple discharge or bleeding, No axillary adenopathy Heart: regular rate and rhythm Abdomen: soft, non-tender; no masses, no organomegaly Extremities: extremities normal, atraumatic, no cyanosis or edema Skin: skin color, texture, turgor normal. No rashes or lesions Lymph nodes: cervical, supraclavicular, and axillary nodes normal. Neurologic: grossly normal  Pelvic: External genitalia:  no lesions              No abnormal inguinal nodes palpated.              Urethra:  normal appearing urethra with no masses, tenderness or lesions              Bartholins and Skenes: normal                 Vagina: normal appearing vagina with normal color and discharge, no lesions              Cervix: no lesions              Pap taken: no Bimanual Exam:  Uterus:  normal size, contour, position, consistency, mobility, non-tender              Adnexa: no mass, fullness, tenderness              Rectal exam: yes.  Confirms.              Anus:  normal sphincter tone, no lesions  Chaperone was present for exam:  Kari HERO, CMA  ASSESSMENT: Well woman visit with gynecologic exam. Menopausal symptoms.  Migraine without aura. Hx benign left breast biopsy.  Mother and maternal grandmother with breast cancer.  Patient has negative genetic testing.  Increased lifetime risk of breast cancer.  Family history of  prostate cancer. PHQ-2-9: 1.  Stress.  Off Wellbutrin  XL. Elevated BP readings.   PLAN: Mammogram screening discussed. Self breast awareness reviewed. Pap and HRV collected:  no.  Due  in 2028. Guidelines for Calcium, Vitamin D , regular exercise program including cardiovascular and weight bearing exercise. Medication refills:  NA. Discused WHI and use of HRT which can increase risk of PE, DVT, MI, stroke and breast cancer.   No Rx given.  Consider counseling through [employer].  Continue self monitoring of BP at home.  If 130/80, she will need to pursue medical follow up. She will establish care with PCP.  Follow up:  yearly and prn.

## 2023-11-29 ENCOUNTER — Ambulatory Visit (INDEPENDENT_AMBULATORY_CARE_PROVIDER_SITE_OTHER): Payer: 59 | Admitting: Obstetrics and Gynecology

## 2023-11-29 ENCOUNTER — Encounter: Payer: Self-pay | Admitting: Obstetrics and Gynecology

## 2023-11-29 VITALS — BP 152/84 | HR 119 | Ht 62.0 in | Wt 128.0 lb

## 2023-11-29 DIAGNOSIS — Z0000 Encounter for general adult medical examination without abnormal findings: Secondary | ICD-10-CM

## 2023-11-29 DIAGNOSIS — Z01419 Encounter for gynecological examination (general) (routine) without abnormal findings: Secondary | ICD-10-CM

## 2023-11-29 DIAGNOSIS — Z1331 Encounter for screening for depression: Secondary | ICD-10-CM | POA: Diagnosis not present

## 2023-11-29 NOTE — Patient Instructions (Signed)

## 2023-11-29 NOTE — Addendum Note (Signed)
 Addended by: CATHLYN JAYSON CARY, BOBIE E on: 11/29/2023 10:21 AM   Modules accepted: Orders

## 2023-11-30 ENCOUNTER — Ambulatory Visit: Payer: Self-pay | Admitting: Obstetrics and Gynecology

## 2023-11-30 LAB — COMPREHENSIVE METABOLIC PANEL WITH GFR
AG Ratio: 1.9 (calc) (ref 1.0–2.5)
ALT: 15 U/L (ref 6–29)
AST: 19 U/L (ref 10–35)
Albumin: 4.9 g/dL (ref 3.6–5.1)
Alkaline phosphatase (APISO): 69 U/L (ref 37–153)
BUN: 13 mg/dL (ref 7–25)
CO2: 25 mmol/L (ref 20–32)
Calcium: 9.8 mg/dL (ref 8.6–10.4)
Chloride: 103 mmol/L (ref 98–110)
Creat: 0.73 mg/dL (ref 0.50–1.03)
Globulin: 2.6 g/dL (ref 1.9–3.7)
Glucose, Bld: 100 mg/dL — ABNORMAL HIGH (ref 65–99)
Potassium: 4.6 mmol/L (ref 3.5–5.3)
Sodium: 142 mmol/L (ref 135–146)
Total Bilirubin: 0.7 mg/dL (ref 0.2–1.2)
Total Protein: 7.5 g/dL (ref 6.1–8.1)
eGFR: 99 mL/min/1.73m2 (ref >=60)

## 2023-11-30 LAB — LIPID PANEL
Cholesterol: 264 mg/dL — ABNORMAL HIGH (ref <200)
HDL: 101 mg/dL (ref >=50)
LDL Cholesterol (Calc): 144 mg/dL — ABNORMAL HIGH
Non-HDL Cholesterol (Calc): 163 mg/dL — ABNORMAL HIGH (ref <130)
Total CHOL/HDL Ratio: 2.6 (calc) (ref <5.0)
Triglycerides: 83 mg/dL (ref <150)

## 2023-11-30 LAB — CBC
HCT: 43.3 % (ref 35.0–45.0)
Hemoglobin: 14.2 g/dL (ref 11.7–15.5)
MCH: 29.8 pg (ref 27.0–33.0)
MCHC: 32.8 g/dL (ref 32.0–36.0)
MCV: 90.8 fL (ref 80.0–100.0)
MPV: 10.1 fL (ref 7.5–12.5)
Platelets: 272 Thousand/uL (ref 140–400)
RBC: 4.77 Million/uL (ref 3.80–5.10)
RDW: 12 % (ref 11.0–15.0)
WBC: 4.3 Thousand/uL (ref 3.8–10.8)

## 2023-11-30 LAB — TSH: TSH: 1.87 m[IU]/L

## 2023-11-30 LAB — VITAMIN D 25 HYDROXY (VIT D DEFICIENCY, FRACTURES): Vit D, 25-Hydroxy: 72 ng/mL (ref 30–100)

## 2023-12-31 ENCOUNTER — Other Ambulatory Visit: Payer: Self-pay | Admitting: Obstetrics and Gynecology

## 2023-12-31 DIAGNOSIS — Z1231 Encounter for screening mammogram for malignant neoplasm of breast: Secondary | ICD-10-CM

## 2024-02-14 ENCOUNTER — Ambulatory Visit
Admission: RE | Admit: 2024-02-14 | Discharge: 2024-02-14 | Disposition: A | Source: Ambulatory Visit | Attending: Obstetrics and Gynecology | Admitting: Obstetrics and Gynecology

## 2024-02-14 DIAGNOSIS — Z1231 Encounter for screening mammogram for malignant neoplasm of breast: Secondary | ICD-10-CM | POA: Diagnosis not present

## 2024-02-15 DIAGNOSIS — H318 Other specified disorders of choroid: Secondary | ICD-10-CM | POA: Diagnosis not present

## 2024-02-21 ENCOUNTER — Ambulatory Visit: Payer: Self-pay | Admitting: Obstetrics and Gynecology

## 2024-12-06 ENCOUNTER — Ambulatory Visit: Admitting: Obstetrics and Gynecology
